# Patient Record
Sex: Male | Born: 1955 | Race: White | Hispanic: No | Marital: Married | Smoking: Former smoker
Health system: Southern US, Community
[De-identification: ages and names within clinical notes are randomized; demographics above are authoritative.]

## PROBLEM LIST (undated history)

## (undated) DIAGNOSIS — E039 Hypothyroidism, unspecified: Secondary | ICD-10-CM

## (undated) DIAGNOSIS — E7800 Pure hypercholesterolemia, unspecified: Secondary | ICD-10-CM

## (undated) DIAGNOSIS — IMO0001 Reserved for inherently not codable concepts without codable children: Secondary | ICD-10-CM

## (undated) DIAGNOSIS — J45909 Unspecified asthma, uncomplicated: Secondary | ICD-10-CM

## (undated) HISTORY — PX: NASAL SINUS SURGERY: SHX719

---

## 1994-12-28 HISTORY — PX: FLEXIBLE SIGMOIDOSCOPY: SHX1649

## 2014-10-24 ENCOUNTER — Encounter (INDEPENDENT_AMBULATORY_CARE_PROVIDER_SITE_OTHER): Payer: Self-pay | Admitting: *Deleted

## 2014-11-01 ENCOUNTER — Encounter (INDEPENDENT_AMBULATORY_CARE_PROVIDER_SITE_OTHER): Payer: Self-pay | Admitting: *Deleted

## 2014-11-01 ENCOUNTER — Telehealth (INDEPENDENT_AMBULATORY_CARE_PROVIDER_SITE_OTHER): Payer: Self-pay | Admitting: *Deleted

## 2014-11-01 ENCOUNTER — Other Ambulatory Visit (INDEPENDENT_AMBULATORY_CARE_PROVIDER_SITE_OTHER): Payer: Self-pay | Admitting: *Deleted

## 2014-11-01 DIAGNOSIS — Z1211 Encounter for screening for malignant neoplasm of colon: Secondary | ICD-10-CM

## 2014-11-01 NOTE — Telephone Encounter (Signed)
Patient needs movi prep 

## 2014-11-05 MED ORDER — PEG-KCL-NACL-NASULF-NA ASC-C 100 G PO SOLR: 1.0000 | Freq: Once | ORAL | Status: DC

## 2014-12-25 ENCOUNTER — Telehealth (INDEPENDENT_AMBULATORY_CARE_PROVIDER_SITE_OTHER): Payer: Self-pay | Admitting: *Deleted

## 2014-12-25 NOTE — Telephone Encounter (Signed)
Referring MD/PCP: howard   Procedure: tcs  Reason/Indication:  screening  Has patient had this procedure before?  no  If so, when, by whom and where?    Is there a family history of colon cancer?  no  Who?  What age when diagnosed?    Is patient diabetic?   no      Does patient have prosthetic heart valve?  no  Do you have a pacemaker?  no  Has patient ever had endocarditis? no  Has patient had joint replacement within last 12 months?  no  Does patient tend to be constipated or take laxatives? no  Is patient on Coumadin, Plavix and/or Aspirin? no  Medications: Advair inhaler 100/50 mg, simvastatin 20 mg daily, levothyroxine 150 mcg daily, astelin nasal spray prn, nasonex prn, pro-air prn  Allergies: nkda  Medication Adjustment:   Procedure date & time: 01/24/15 at 830

## 2014-12-25 NOTE — Telephone Encounter (Signed)
agree

## 2015-01-24 ENCOUNTER — Encounter (HOSPITAL_COMMUNITY): Payer: Self-pay | Admitting: *Deleted

## 2015-01-24 ENCOUNTER — Ambulatory Visit (HOSPITAL_COMMUNITY)
Admission: RE | Admit: 2015-01-24 | Discharge: 2015-01-24 | Disposition: A | Discharge status: Home / Self Care | Payer: PRIVATE HEALTH INSURANCE | Source: Ambulatory Visit | Attending: Internal Medicine | Admitting: Internal Medicine

## 2015-01-24 ENCOUNTER — Encounter (HOSPITAL_COMMUNITY)
Admission: RE | Disposition: A | Discharge status: Home / Self Care | Payer: Self-pay | Source: Ambulatory Visit | Attending: Internal Medicine

## 2015-01-24 DIAGNOSIS — E039 Hypothyroidism, unspecified: Secondary | ICD-10-CM | POA: Diagnosis not present

## 2015-01-24 DIAGNOSIS — K644 Residual hemorrhoidal skin tags: Secondary | ICD-10-CM | POA: Insufficient documentation

## 2015-01-24 DIAGNOSIS — Z1211 Encounter for screening for malignant neoplasm of colon: Secondary | ICD-10-CM

## 2015-01-24 DIAGNOSIS — D122 Benign neoplasm of ascending colon: Secondary | ICD-10-CM | POA: Insufficient documentation

## 2015-01-24 DIAGNOSIS — E780 Pure hypercholesterolemia: Secondary | ICD-10-CM | POA: Insufficient documentation

## 2015-01-24 DIAGNOSIS — K5730 Diverticulosis of large intestine without perforation or abscess without bleeding: Secondary | ICD-10-CM | POA: Insufficient documentation

## 2015-01-24 DIAGNOSIS — Z79899 Other long term (current) drug therapy: Secondary | ICD-10-CM | POA: Insufficient documentation

## 2015-01-24 DIAGNOSIS — J45909 Unspecified asthma, uncomplicated: Secondary | ICD-10-CM | POA: Diagnosis not present

## 2015-01-24 DIAGNOSIS — Z87891 Personal history of nicotine dependence: Secondary | ICD-10-CM | POA: Insufficient documentation

## 2015-01-24 DIAGNOSIS — K649 Unspecified hemorrhoids: Secondary | ICD-10-CM

## 2015-01-24 DIAGNOSIS — D124 Benign neoplasm of descending colon: Secondary | ICD-10-CM

## 2015-01-24 HISTORY — DX: Hypothyroidism, unspecified: E03.9

## 2015-01-24 HISTORY — DX: Unspecified asthma, uncomplicated: J45.909

## 2015-01-24 HISTORY — PX: COLONOSCOPY: SHX5424

## 2015-01-24 HISTORY — DX: Pure hypercholesterolemia, unspecified: E78.00

## 2015-01-24 HISTORY — DX: Reserved for inherently not codable concepts without codable children: IMO0001

## 2015-01-24 SURGERY — COLONOSCOPY
Anesthesia: Moderate Sedation

## 2015-01-24 MED ORDER — MEPERIDINE HCL 50 MG/ML IJ SOLN
INTRAMUSCULAR | Status: AC
  Filled 2015-01-24: qty 1

## 2015-01-24 MED ORDER — STERILE WATER FOR IRRIGATION IR SOLN
Status: DC | PRN
  Administered 2015-01-24: 09:00:00

## 2015-01-24 MED ORDER — SODIUM CHLORIDE 0.9 % IV SOLN
INTRAVENOUS | Status: DC
  Administered 2015-01-24: 08:00:00 via INTRAVENOUS

## 2015-01-24 MED ORDER — MEPERIDINE HCL 50 MG/ML IJ SOLN
INTRAMUSCULAR | Status: DC | PRN
  Administered 2015-01-24 (×2): 25 mg via INTRAVENOUS

## 2015-01-24 MED ORDER — MIDAZOLAM HCL 5 MG/5ML IJ SOLN
INTRAMUSCULAR | Status: DC | PRN
  Administered 2015-01-24 (×3): 2 mg via INTRAVENOUS

## 2015-01-24 MED ORDER — MIDAZOLAM HCL 5 MG/5ML IJ SOLN
INTRAMUSCULAR | Status: AC
  Filled 2015-01-24: qty 10

## 2015-01-24 NOTE — Discharge Instructions (Signed)
Resume usual medications and high fiber diet. No driving for 24 hours.      High-Fiber Diet Fiber is found in fruits, vegetables, and grains. A high-fiber diet encourages the addition of more whole grains, legumes, fruits, and vegetables in your diet. The recommended amount of fiber for adult males is 38 g per day. For adult females, it is 25 g per day. Pregnant and lactating women should get 28 g of fiber per day. If you have a digestive or bowel problem, ask your caregiver for advice before adding high-fiber foods to your diet. Eat a variety of high-fiber foods instead of only a select few type of foods.  PURPOSE  To increase stool bulk.  To make bowel movements more regular to prevent constipation.  To lower cholesterol.  To prevent overeating. WHEN IS THIS DIET USED?  It may be used if you have constipation and hemorrhoids.  It may be used if you have uncomplicated diverticulosis (intestine condition) and irritable bowel syndrome.  It may be used if you need help with weight management.  It may be used if you want to add it to your diet as a protective measure against atherosclerosis, diabetes, and cancer. SOURCES OF FIBER  Whole-grain breads and cereals.  Fruits, such as apples, oranges, bananas, berries, prunes, and pears.  Vegetables, such as green peas, carrots, sweet potatoes, beets, broccoli, cabbage, spinach, and artichokes.  Legumes, such split peas, soy, lentils.  Almonds. FIBER CONTENT IN FOODS Starches and Grains / Dietary Fiber (g)  Cheerios, 1 cup / 3 g  Corn Flakes cereal, 1 cup / 0.7 g  Rice crispy treat cereal, 1 cup / 0.3 g  Instant oatmeal (cooked),  cup / 2 g  Frosted wheat cereal, 1 cup / 5.1 g  Brown, long-grain rice (cooked), 1 cup / 3.5 g  White, long-grain rice (cooked), 1 cup / 0.6 g  Enriched macaroni (cooked), 1 cup / 2.5 g Legumes / Dietary Fiber (g)  Baked beans (canned, plain, or vegetarian),  cup / 5.2 g  Kidney beans  (canned),  cup / 6.8 g  Pinto beans (cooked),  cup / 5.5 g Breads and Crackers / Dietary Fiber (g)  Plain or honey graham crackers, 2 squares / 0.7 g  Saltine crackers, 3 squares / 0.3 g  Plain, salted pretzels, 10 pieces / 1.8 g  Whole-wheat bread, 1 slice / 1.9 g  White bread, 1 slice / 0.7 g  Raisin bread, 1 slice / 1.2 g  Plain bagel, 3 oz / 2 g  Flour tortilla, 1 oz / 0.9 g  Corn tortilla, 1 small / 1.5 g  Hamburger or hotdog bun, 1 small / 0.9 g Fruits / Dietary Fiber (g)  Apple with skin, 1 medium / 4.4 g  Sweetened applesauce,  cup / 1.5 g  Banana,  medium / 1.5 g  Grapes, 10 grapes / 0.4 g  Orange, 1 small / 2.3 g  Raisin, 1.5 oz / 1.6 g  Melon, 1 cup / 1.4 g Vegetables / Dietary Fiber (g)  Green beans (canned),  cup / 1.3 g  Carrots (cooked),  cup / 2.3 g  Broccoli (cooked),  cup / 2.8 g  Peas (cooked),  cup / 4.4 g  Mashed potatoes,  cup / 1.6 g  Lettuce, 1 cup / 0.5 g  Corn (canned),  cup / 1.6 g  Tomato,  cup / 1.1 g Document Released: 12/14/2005 Document Revised: 06/14/2012 Document Reviewed: 03/17/2012 [employer] Patient Information 2015 [employer]. This information is not intended to replace advice given to you by your health care provider. Make sure you discuss any questions you have with your health care provider. Physician will call with biopsy results.        Colonoscopy, Care After These instructions give you information on caring for yourself after your procedure. Your doctor may also give you more specific instructions. Call your doctor if you have any problems or questions after your procedure. HOME CARE  Do not drive for 24 hours.  Do not sign important papers or use machinery for 24 hours.  You may shower.  You may go back to your usual activities, but go slower for the first 24 hours.  Take rest breaks often during the first 24 hours.  Walk around or use warm packs on your belly (abdomen) if you  have belly cramping or gas.  Drink enough fluids to keep your pee (urine) clear or pale yellow.  Resume your normal diet. Avoid heavy or fried foods.  Avoid drinking alcohol for 24 hours or as told by your doctor.  Only take medicines as told by your doctor. If a tissue sample (biopsy) was taken during the procedure:   Do not take aspirin or blood thinners for 7 days, or as told by your doctor.  Do not drink alcohol for 7 days, or as told by your doctor.  Eat soft foods for the first 24 hours. GET HELP IF: You still have a small amount of blood in your poop (stool) 2-3 days after the procedure. GET HELP RIGHT AWAY IF:  You have more than a small amount of blood in your poop.  You see clumps of tissue (blood clots) in your poop.  Your belly is puffy (swollen).  You feel sick to your stomach (nauseous) or throw up (vomit).  You have a fever.  You have belly pain that gets worse and medicine does not help. MAKE SURE YOU:  Understand these instructions.  Will watch your condition.  Will get help right away if you are not doing well or get worse. Document Released: 01/16/2011 Document Revised: 12/19/2013 Document Reviewed: 08/21/2013 [facility] Patient Information 2015 [place], [place]. This information is not intended to replace advice given to you by your health care provider. Make sure you discuss any questions you have with your health care provider.

## 2015-01-24 NOTE — H&P (Signed)
Chad Curtis is an 59 y.o. male.   Chief Complaint: Patient is here for colonoscopy. HPI: Patient is 59-year-old Caucasian male who is here for screening colonoscopy. He denies abdominal pain change in bowel habits or rectal bleeding. This is patient's first colonoscopy. Family history is negative for CRC.  Past Medical History  Diagnosis Date  . Hypercholesteremia   . Asthma   . Shortness of breath dyspnea   . Hypothyroidism     Past Surgical History  Procedure Laterality Date  . Flexible sigmoidoscopy  1996  . Nasal sinus surgery      History reviewed. No pertinent family history. Social History:  reports that he has quit smoking. His smoking use included Cigarettes. He has a 2.5 pack-year smoking history. He does not have any smokeless tobacco history on file. He reports that he does not drink alcohol or use illicit drugs.  Allergies: No Known Allergies  Medications Prior to Admission  Medication Sig Dispense Refill  . Fluticasone-Salmeterol (ADVAIR) 100-50 MCG/DOSE AEPB Inhale 1 puff into the lungs 2 (two) times daily.    . levothyroxine (SYNTHROID, LEVOTHROID) 150 MCG tablet Take 150 mcg by mouth daily before breakfast.    . mometasone (NASONEX) 50 MCG/ACT nasal spray Place 2 sprays into the nose daily as needed (congestion).    . peg 3350 powder (MOVIPREP) 100 G SOLR Take 1 kit (200 g total) by mouth once. 1 kit 0  . simvastatin (ZOCOR) 20 MG tablet Take 20 mg by mouth 2 (two) times daily.      No results found for this or any previous visit (from the past 48 hour(s)). No results found.  ROS  Blood pressure 142/85, pulse 83, temperature 98.1 F (36.7 C), temperature source Oral, resp. rate 15, height 5' 11" (1.803 m), weight 230 lb (104.327 kg), SpO2 98 %. Physical Exam  Constitutional: He appears well-developed and well-nourished.  HENT:  Mouth/Throat: Oropharynx is clear and moist.  Eyes: Conjunctivae are normal. No scleral icterus.  Neck: No thyromegaly  present.  Cardiovascular: Normal rate, regular rhythm and normal heart sounds.   Respiratory: Effort normal and breath sounds normal.  GI: Soft. He exhibits no distension and no mass. There is no tenderness.  Musculoskeletal: He exhibits no edema.  Lymphadenopathy:    He has no cervical adenopathy.  Neurological: He is alert.  Skin: Skin is warm and dry.     Assessment/Plan Average risk screening colonoscopy.  REHMAN,NAJEEB U 01/24/2015, 8:32 AM    

## 2015-01-24 NOTE — Op Note (Signed)
COLONOSCOPY PROCEDURE REPORT  PATIENT:  Chad Curtis  MR#:  [id number] Birthdate:  09/29/1956, 59 y.o., male Endoscopist:  Dr. Rogene Houston, MD Referred By:  Dr. Rory Percy, MD  Procedure Date: 01/24/2015  Procedure:   Colonoscopy  Indications:  Patient is 59 year old Caucasian male was undergoing average risk screening colonoscopy.  Informed Consent:  The procedure and risks were reviewed with the patient and informed consent was obtained.  Medications:  Demerol 50 mg IV Versed 6 mg IV  Description of procedure:  After a digital rectal exam was performed, that colonoscope was advanced from the anus through the rectum and colon to the area of the cecum, ileocecal valve and appendiceal orifice. The cecum was deeply intubated. These structures were well-seen and photographed for the record. From the level of the cecum and ileocecal valve, the scope was slowly and cautiously withdrawn. The mucosal surfaces were carefully surveyed utilizing scope tip to flexion to facilitate fold flattening as needed. The scope was pulled down into the rectum where a thorough exam including retroflexion was performed.  Findings:   Prep excellent. Three small polyps cold snared from descending colon and submitted together. Few diverticula at descending and sigmoid colon. Normal rectal mucosa. Small hemorrhoids below the dentate line.    Therapeutic/Diagnostic Maneuvers Performed:  See above  Complications:  None  Cecal Withdrawal Time:  24 minutes  Impression:  Examination performed to cecum. Mild left-sided diverticulosis. Three small polyps cold snared from ascending colon and submitted together. External hemorrhoids.  Recommendations:  Standard instructions given. I will contact patient with biopsy results and further recommendations.  REHMAN,NAJEEB U  01/24/2015 9:16 AM  CC: Dr. Rory Percy, MD & Dr. Rayne Du ref. provider found

## 2015-01-25 ENCOUNTER — Encounter (HOSPITAL_COMMUNITY): Payer: Self-pay | Admitting: Internal Medicine

## 2015-02-13 ENCOUNTER — Encounter (INDEPENDENT_AMBULATORY_CARE_PROVIDER_SITE_OTHER): Payer: Self-pay | Admitting: *Deleted

## 2020-01-23 ENCOUNTER — Encounter (INDEPENDENT_AMBULATORY_CARE_PROVIDER_SITE_OTHER): Payer: Self-pay | Admitting: *Deleted

## 2020-03-05 ENCOUNTER — Other Ambulatory Visit (INDEPENDENT_AMBULATORY_CARE_PROVIDER_SITE_OTHER): Payer: Self-pay | Admitting: *Deleted

## 2020-03-05 DIAGNOSIS — Z86010 Personal history of colonic polyps: Secondary | ICD-10-CM

## 2020-04-01 ENCOUNTER — Encounter (INDEPENDENT_AMBULATORY_CARE_PROVIDER_SITE_OTHER): Payer: Self-pay | Admitting: *Deleted

## 2020-04-10 ENCOUNTER — Encounter (INDEPENDENT_AMBULATORY_CARE_PROVIDER_SITE_OTHER): Payer: Self-pay | Admitting: *Deleted

## 2020-04-18 ENCOUNTER — Ambulatory Visit (INDEPENDENT_AMBULATORY_CARE_PROVIDER_SITE_OTHER): Payer: Self-pay

## 2020-04-18 ENCOUNTER — Other Ambulatory Visit: Payer: Self-pay

## 2020-04-18 ENCOUNTER — Telehealth (INDEPENDENT_AMBULATORY_CARE_PROVIDER_SITE_OTHER): Payer: Self-pay | Admitting: *Deleted

## 2020-04-18 NOTE — Telephone Encounter (Signed)
Referring MD/PCP: howard   Procedure: tcs  Reason/Indication:  Hx polyps  Has patient had this procedure before?  Yes, 2016  If so, when, by whom and where?    Is there a family history of colon cancer?  no  Who?  What age when diagnosed?    Is patient diabetic?   no      Does patient have prosthetic heart valve or mechanical valve?  no  Do you have a pacemaker/defibrillator?  no  Has patient ever had endocarditis/atrial fibrillation? no  Does patient use oxygen? no  Has patient had joint replacement within last 12 months?  no  Is patient constipated or do they take laxatives? no  Does patient have a history of alcohol/drug use?  no  Is patient on blood thinner such as Coumadin, Plavix and/or Aspirin? no  Medications: levothyroxine 150 mcg daily, omeprazole 20 mg daily  Allergies: nkda  Medication Adjustment per Dr Laural Golden:   Procedure date & time: 05/16/20

## 2020-04-19 ENCOUNTER — Telehealth (INDEPENDENT_AMBULATORY_CARE_PROVIDER_SITE_OTHER): Payer: Self-pay | Admitting: *Deleted

## 2020-04-19 MED ORDER — SUTAB 1479-225-188 MG PO TABS: 1.0000 | ORAL_TABLET | Freq: Once | ORAL | 0 refills | Status: AC

## 2020-04-19 NOTE — Telephone Encounter (Signed)
Patient needs Sutab (copay card) ° °

## 2020-04-22 ENCOUNTER — Other Ambulatory Visit (INDEPENDENT_AMBULATORY_CARE_PROVIDER_SITE_OTHER): Payer: Self-pay | Admitting: *Deleted

## 2020-04-26 NOTE — Telephone Encounter (Signed)
Ok to schedule.

## 2020-05-14 ENCOUNTER — Other Ambulatory Visit: Payer: Self-pay

## 2020-05-14 ENCOUNTER — Other Ambulatory Visit (HOSPITAL_COMMUNITY)
Admission: RE | Admit: 2020-05-14 | Discharge: 2020-05-14 | Disposition: A | Discharge status: Home / Self Care | Payer: PRIVATE HEALTH INSURANCE | Source: Ambulatory Visit | Attending: Internal Medicine | Admitting: Internal Medicine

## 2020-05-14 DIAGNOSIS — Z20822 Contact with and (suspected) exposure to covid-19: Secondary | ICD-10-CM | POA: Insufficient documentation

## 2020-05-14 DIAGNOSIS — Z01812 Encounter for preprocedural laboratory examination: Secondary | ICD-10-CM | POA: Diagnosis not present

## 2020-05-15 LAB — SARS CORONAVIRUS 2 (TAT 6-24 HRS): SARS Coronavirus 2: NEGATIVE

## 2020-05-16 ENCOUNTER — Other Ambulatory Visit: Payer: Self-pay

## 2020-05-16 ENCOUNTER — Encounter (HOSPITAL_COMMUNITY): Payer: Self-pay | Admitting: Internal Medicine

## 2020-05-16 ENCOUNTER — Encounter (HOSPITAL_COMMUNITY)
Admission: RE | Disposition: A | Discharge status: Home / Self Care | Payer: Self-pay | Source: Home / Self Care | Attending: Internal Medicine

## 2020-05-16 ENCOUNTER — Ambulatory Visit (HOSPITAL_COMMUNITY)
Admission: RE | Admit: 2020-05-16 | Discharge: 2020-05-16 | Disposition: A | Discharge status: Home / Self Care | Payer: PRIVATE HEALTH INSURANCE | Attending: Internal Medicine | Admitting: Internal Medicine

## 2020-05-16 DIAGNOSIS — Z87891 Personal history of nicotine dependence: Secondary | ICD-10-CM | POA: Insufficient documentation

## 2020-05-16 DIAGNOSIS — Z86010 Personal history of colonic polyps: Secondary | ICD-10-CM | POA: Diagnosis not present

## 2020-05-16 DIAGNOSIS — K5730 Diverticulosis of large intestine without perforation or abscess without bleeding: Secondary | ICD-10-CM | POA: Insufficient documentation

## 2020-05-16 DIAGNOSIS — Z1211 Encounter for screening for malignant neoplasm of colon: Secondary | ICD-10-CM | POA: Insufficient documentation

## 2020-05-16 DIAGNOSIS — K644 Residual hemorrhoidal skin tags: Secondary | ICD-10-CM | POA: Diagnosis not present

## 2020-05-16 DIAGNOSIS — Z79899 Other long term (current) drug therapy: Secondary | ICD-10-CM | POA: Insufficient documentation

## 2020-05-16 DIAGNOSIS — Z7951 Long term (current) use of inhaled steroids: Secondary | ICD-10-CM | POA: Diagnosis not present

## 2020-05-16 DIAGNOSIS — Z79890 Hormone replacement therapy (postmenopausal): Secondary | ICD-10-CM | POA: Insufficient documentation

## 2020-05-16 DIAGNOSIS — E7800 Pure hypercholesterolemia, unspecified: Secondary | ICD-10-CM | POA: Insufficient documentation

## 2020-05-16 DIAGNOSIS — E039 Hypothyroidism, unspecified: Secondary | ICD-10-CM | POA: Diagnosis not present

## 2020-05-16 DIAGNOSIS — J45909 Unspecified asthma, uncomplicated: Secondary | ICD-10-CM | POA: Insufficient documentation

## 2020-05-16 HISTORY — PX: COLONOSCOPY: SHX5424

## 2020-05-16 SURGERY — COLONOSCOPY
Anesthesia: Moderate Sedation

## 2020-05-16 MED ORDER — MIDAZOLAM HCL 5 MG/5ML IJ SOLN
INTRAMUSCULAR | Status: DC | PRN
  Administered 2020-05-16 (×2): 1 mg via INTRAVENOUS
  Administered 2020-05-16 (×2): 2 mg via INTRAVENOUS

## 2020-05-16 MED ORDER — MIDAZOLAM HCL 5 MG/5ML IJ SOLN
INTRAMUSCULAR | Status: AC
  Filled 2020-05-16: qty 10

## 2020-05-16 MED ORDER — SODIUM CHLORIDE 0.9 % IV SOLN: INTRAVENOUS | Status: DC

## 2020-05-16 MED ORDER — MEPERIDINE HCL 50 MG/ML IJ SOLN
INTRAMUSCULAR | Status: DC | PRN
  Administered 2020-05-16 (×2): 25 mg via INTRAVENOUS

## 2020-05-16 MED ORDER — MEPERIDINE HCL 50 MG/ML IJ SOLN
INTRAMUSCULAR | Status: AC
  Filled 2020-05-16: qty 1

## 2020-05-16 MED ORDER — STERILE WATER FOR IRRIGATION IR SOLN
Status: DC | PRN
  Administered 2020-05-16: 1.5 mL

## 2020-05-16 NOTE — Discharge Instructions (Signed)
Resume usual medications as before. High-fiber diet. No driving for 24 hours. Next colonoscopy in 7 years.   Colonoscopy, Adult, Care After This sheet gives you information about how to care for yourself after your procedure. Your doctor may also give you more specific instructions. If you have problems or questions, call your doctor. What can I expect after the procedure? After the procedure, it is common to have:  A small amount of blood in your poop (stool) for 24 hours.  Some gas.  Mild cramping or bloating in your belly (abdomen). Follow these instructions at home: Eating and drinking   Drink enough fluid to keep your pee (urine) pale yellow.  Follow instructions from your doctor about what you cannot eat or drink.  Return to your normal diet as told by your doctor. Avoid heavy or fried foods that are hard to digest. Activity  Rest as told by your doctor.  Do not sit for a long time without moving. Get up to take short walks every 1-2 hours. This is important. Ask for help if you feel weak or unsteady.  Return to your normal activities as told by your doctor. Ask your doctor what activities are safe for you. To help cramping and bloating:   Try walking around.  Put heat on your belly as told by your doctor. Use the heat source that your doctor recommends, such as a moist heat pack or a heating pad. ? Put a towel between your skin and the heat source. ? Leave the heat on for 20-30 minutes. ? Remove the heat if your skin turns bright red. This is very important if you are unable to feel pain, heat, or cold. You may have a greater risk of getting burned. General instructions  For the first 24 hours after the procedure: ? Do not drive or use machinery. ? Do not sign important documents. ? Do not drink alcohol. ? Do your daily activities more slowly than normal. ? Eat foods that are soft and easy to digest.  Take over-the-counter or prescription medicines only as  told by your doctor.  Keep all follow-up visits as told by your doctor. This is important. Contact a doctor if:  You have blood in your poop 2-3 days after the procedure. Get help right away if:  You have more than a small amount of blood in your poop.  You see large clumps of tissue (blood clots) in your poop.  Your belly is swollen.  You feel like you may vomit (nauseous).  You vomit.  You have a fever.  You have belly pain that gets worse, and medicine does not help your pain. Summary  After the procedure, it is common to have a small amount of blood in your poop. You may also have mild cramping and bloating in your belly.  For the first 24 hours after the procedure, do not drive or use machinery, do not sign important documents, and do not drink alcohol.  Get help right away if you have a lot of blood in your poop, feel like you may vomit, have a fever, or have more belly pain. This information is not intended to replace advice given to you by your health care provider. Make sure you discuss any questions you have with your health care provider. Document Revised: 07/10/2019 Document Reviewed: 07/10/2019 Elsevier Patient Education  [place].  High-Fiber Diet Fiber, also called dietary fiber, is a type of carbohydrate that is found in fruits, vegetables, whole grains,  and beans. A high-fiber diet can have many health benefits. Your health care provider may recommend a high-fiber diet to help:  Prevent constipation. Fiber can make your bowel movements more regular.  Lower your cholesterol.  Relieve the following conditions: ? Swelling of veins in the anus (hemorrhoids). ? Swelling and irritation (inflammation) of specific areas of the digestive tract (uncomplicated diverticulosis). ? A problem of the large intestine (colon) that sometimes causes pain and diarrhea (irritable bowel syndrome, IBS).  Prevent overeating as part of a weight-loss plan.  Prevent heart  disease, type 2 diabetes, and certain cancers. What is my plan? The recommended daily fiber intake in grams (g) includes:  38 g for men age 5 or younger.  30 g for men over age 51.  41 g for women age 9 or younger.  21 g for women over age 33. You can get the recommended daily intake of dietary fiber by:  Eating a variety of fruits, vegetables, grains, and beans.  Taking a fiber supplement, if it is not possible to get enough fiber through your diet. What do I need to know about a high-fiber diet?  It is better to get fiber through food sources rather than from fiber supplements. There is not a lot of research about how effective supplements are.  Always check the fiber content on the nutrition facts label of any prepackaged food. Look for foods that contain 5 g of fiber or more per serving.  Talk with a diet and nutrition specialist (dietitian) if you have questions about specific foods that are recommended or not recommended for your medical condition, especially if those foods are not listed below.  Gradually increase how much fiber you consume. If you increase your intake of dietary fiber too quickly, you may have bloating, cramping, or gas.  Drink plenty of water. Water helps you to digest fiber. What are tips for following this plan?  Eat a wide variety of high-fiber foods.  Make sure that half of the grains that you eat each day are whole grains.  Eat breads and cereals that are made with whole-grain flour instead of refined flour or white flour.  Eat brown rice, bulgur wheat, or millet instead of white rice.  Start the day with a breakfast that is high in fiber, such as a cereal that contains 5 g of fiber or more per serving.  Use beans in place of meat in soups, salads, and pasta dishes.  Eat high-fiber snacks, such as berries, raw vegetables, nuts, and popcorn.  Choose whole fruits and vegetables instead of processed forms like juice or sauce. What foods can I  eat?  Fruits Berries. Pears. Apples. Oranges. Avocado. Prunes and raisins. Dried figs. Vegetables Sweet potatoes. Spinach. Kale. Artichokes. Cabbage. Broccoli. Cauliflower. Green peas. Carrots. Squash. Grains Whole-grain breads. Multigrain cereal. Oats and oatmeal. Brown rice. Barley. Bulgur wheat. [place]. Quinoa. Bran muffins. Popcorn. Rye wafer crackers. Meats and other proteins Navy, kidney, and pinto beans. Soybeans. Split peas. Lentils. Nuts and seeds. Dairy Fiber-fortified yogurt. Beverages Fiber-fortified soy milk. Fiber-fortified orange juice. Other foods Fiber bars. The items listed above may not be a complete list of recommended foods and beverages. Contact a dietitian for more options. What foods are not recommended? Fruits Fruit juice. Cooked, strained fruit. Vegetables Fried potatoes. Canned vegetables. Well-cooked vegetables. Grains White bread. Pasta made with refined flour. White rice. Meats and other proteins Fatty cuts of meat. Fried chicken or fried fish. Dairy Milk. Yogurt. Cream cheese. Sour cream. Fats and  oils But[place]. Beverages Soft drinks. Other foods Cakes and pastries. The items listed above may not be a complete list of foods and beverages to avoid. Contact a dietitian for more information. Summary  Fiber is a type of carbohydrate. It is found in fruits, vegetables, whole grains, and beans.  There are many health benefits of eating a high-fiber diet, such as preventing constipation, lowering blood cholesterol, helping with weight loss, and reducing your risk of heart disease, diabetes, and certain cancers.  Gradually increase your intake of fiber. Increasing too fast can result in cramping, bloating, and gas. Drink plenty of water while you increase your fiber.  The best sources of fiber include whole fruits and vegetables, whole grains, nuts, seeds, and beans. This information is not intended to replace advice given to you by your health care  provider. Make sure you discuss any questions you have with your health care provider. Document Revised: 10/18/2017 Document Reviewed: 10/18/2017 Elsevier Patient Education  2020 [employer].  Hemorrhoids Hemorrhoids are swollen veins in and around the rectum or anus. There are two types of hemorrhoids:  Internal hemorrhoids. These occur in the veins that are just inside the rectum. They may poke through to the outside and become irritated and painful.  External hemorrhoids. These occur in the veins that are outside the anus and can be felt as a painful swelling or hard lump near the anus. Most hemorrhoids do not cause serious problems, and they can be managed with home treatments such as diet and lifestyle changes. If home treatments do not help the symptoms, procedures can be done to shrink or remove the hemorrhoids. What are the causes? This condition is caused by increased pressure in the anal area. This pressure may result from various things, including:  Constipation.  Straining to have a bowel movement.  Diarrhea.  Pregnancy.  Obesity.  Sitting for long periods of time.  Heavy lifting or other activity that causes you to strain.  Anal sex.  Riding a bike for a long period of time. What are the signs or symptoms? Symptoms of this condition include:  Pain.  Anal itching or irritation.  Rectal bleeding.  Leakage of stool (feces).  Anal swelling.  One or more lumps around the anus. How is this diagnosed? This condition can often be diagnosed through a visual exam. Other exams or tests may also be done, such as:  An exam that involves feeling the rectal area with a gloved hand (digital rectal exam).  An exam of the anal canal that is done using a small tube (anoscope).  A blood test, if you have lost a significant amount of blood.  A test to look inside the colon using a flexible tube with a camera on the end (sigmoidoscopy or colonoscopy). How is this  treated? This condition can usually be treated at home. However, various procedures may be done if dietary changes, lifestyle changes, and other home treatments do not help your symptoms. These procedures can help make the hemorrhoids smaller or remove them completely. Some of these procedures involve surgery, and others do not. Common procedures include:  Rubber band ligation. Rubber bands are placed at the base of the hemorrhoids to cut off their blood supply.  Sclerotherapy. Medicine is injected into the hemorrhoids to shrink them.  Infrared coagulation. A type of light energy is used to get rid of the hemorrhoids.  Hemorrhoidectomy surgery. The hemorrhoids are surgically removed, and the veins that supply them are tied off.  Stapled hemorrhoidopexy  surgery. The surgeon staples the base of the hemorrhoid to the rectal wall. Follow these instructions at home: Eating and drinking   Eat foods that have a lot of fiber in them, such as whole grains, beans, nuts, fruits, and vegetables.  Ask your health care provider about taking products that have added fiber (fiber supplements).  Reduce the amount of fat in your diet. You can do this by eating low-fat dairy products, eating less red meat, and avoiding processed foods.  Drink enough fluid to keep your urine pale yellow. Managing pain and swelling   Take warm sitz baths for 20 minutes, 3-4 times a day to ease pain and discomfort. You may do this in a bathtub or using a portable sitz bath that fits over the toilet.  If directed, apply ice to the affected area. Using ice packs between sitz baths may be helpful. ? Put ice in a plastic bag. ? Place a towel between your skin and the bag. ? Leave the ice on for 20 minutes, 2-3 times a day. General instructions  Take over-the-counter and prescription medicines only as told by your health care provider.  Use medicated creams or suppositories as told.  Get regular exercise. Ask your health  care provider how much and what kind of exercise is best for you. In general, you should do moderate exercise for at least 30 minutes on most days of the week (150 minutes each week). This can include activities such as walking, biking, or yoga.  Go to the bathroom when you have the urge to have a bowel movement. Do not wait.  Avoid straining to have bowel movements.  Keep the anal area dry and clean. Use wet toilet paper or moist towelettes after a bowel movement.  Do not sit on the toilet for long periods of time. This increases blood pooling and pain.  Keep all follow-up visits as told by your health care provider. This is important. Contact a health care provider if you have:  Increasing pain and swelling that are not controlled by treatment or medicine.  Difficulty having a bowel movement, or you are unable to have a bowel movement.  Pain or inflammation outside the area of the hemorrhoids. Get help right away if you have:  Uncontrolled bleeding from your rectum. Summary  Hemorrhoids are swollen veins in and around the rectum or anus.  Most hemorrhoids can be managed with home treatments such as diet and lifestyle changes.  Taking warm sitz baths can help ease pain and discomfort.  In severe cases, procedures or surgery can be done to shrink or remove the hemorrhoids. This information is not intended to replace advice given to you by your health care provider. Make sure you discuss any questions you have with your health care provider. Document Revised: 05/12/2019 Document Reviewed: 05/05/2018 Elsevier Patient Education  [place].  Diverticulosis  Diverticulosis is a condition that develops when small pouches (diverticula) form in the wall of the large intestine (colon). The colon is where water is absorbed and stool (feces) is formed. The pouches form when the inside layer of the colon pushes through weak spots in the outer layers of the colon. You may have a few  pouches or many of them. The pouches usually do not cause problems unless they become inflamed or infected. When this happens, the condition is called diverticulitis. What are the causes? The cause of this condition is not known. What increases the risk? The following factors may make you more  likely to develop this condition:  Being older than age 54. Your risk for this condition increases with age. Diverticulosis is rare among people younger than age 60. By age 42, many people have it.  Eating a low-fiber diet.  Having frequent constipation.  Being overweight.  Not getting enough exercise.  Smoking.  Taking over-the-counter pain medicines, like aspirin and ibuprofen.  Having a family history of diverticulosis. What are the signs or symptoms? In most people, there are no symptoms of this condition. If you do have symptoms, they may include:  Bloating.  Cramps in the abdomen.  Constipation or diarrhea.  Pain in the lower left side of the abdomen. How is this diagnosed? Because diverticulosis usually has no symptoms, it is most often diagnosed during an exam for other colon problems. The condition may be diagnosed by:  Using a flexible scope to examine the colon (colonoscopy).  Taking an X-ray of the colon after dye has been put into the colon (barium enema).  Having a CT scan. How is this treated? You may not need treatment for this condition. Your health care provider may recommend treatment to prevent problems. You may need treatment if you have symptoms or if you previously had diverticulitis. Treatment may include:  Eating a high-fiber diet.  Taking a fiber supplement.  Taking a live bacteria supplement (probiotic).  Taking medicine to relax your colon. Follow these instructions at home: Medicines  Take over-the-counter and prescription medicines only as told by your health care provider.  If told by your health care provider, take a fiber supplement or  probiotic. Constipation prevention Your condition may cause constipation. To prevent or treat constipation, you may need to:  Drink enough fluid to keep your urine pale yellow.  Take over-the-counter or prescription medicines.  Eat foods that are high in fiber, such as beans, whole grains, and fresh fruits and vegetables.  Limit foods that are high in fat and processed sugars, such as fried or sweet foods.  General instructions  Try not to strain when you have a bowel movement.  Keep all follow-up visits as told by your health care provider. This is important. Contact a health care provider if you:  Have pain in your abdomen.  Have bloating.  Have cramps.  Have not had a bowel movement in 3 days. Get help right away if:  Your pain gets worse.  Your bloating becomes very bad.  You have a fever or chills, and your symptoms suddenly get worse.  You vomit.  You have bowel movements that are bloody or black.  You have bleeding from your rectum. Summary  Diverticulosis is a condition that develops when small pouches (diverticula) form in the wall of the large intestine (colon).  You may have a few pouches or many of them.  This condition is most often diagnosed during an exam for other colon problems.  Treatment may include increasing the fiber in your diet, taking supplements, or taking medicines. This information is not intended to replace advice given to you by your health care provider. Make sure you discuss any questions you have with your health care provider. Document Revised: 07/13/2019 Document Reviewed: 07/13/2019 Elsevier Patient Education  [place].

## 2020-05-16 NOTE — H&P (Signed)
Chad Curtis is an 64 y.o. male.   Chief Complaint: Patient is here for colonoscopy. HPI: Patient is 64 year old Caucasian male who has history of colonic adenomas and is here for surveillance colonoscopy.  His last exam was in February 2016 with removal of 3 small tubular adenomas.  He denies abdominal pain change in bowel habits or rectal bleeding. Family history is negative for CRC. Patient does not take antiplatelet agents or anticoagulants.  Past Medical History:  Diagnosis Date  . Asthma   . Hypercholesteremia   . Hypothyroidism   . Shortness of breath dyspnea     Past Surgical History:  Procedure Laterality Date  . COLONOSCOPY N/A 01/24/2015   Procedure: COLONOSCOPY;  Surgeon: Rogene Houston, MD;  Location: [facility];  Service: Endoscopy;  Laterality: N/A;  [address]  . [place]  . NASAL SINUS SURGERY      History reviewed. No pertinent family history. Social History:  reports that he has quit smoking. His smoking use included cigarettes. He has a 2.50 pack-year smoking history. He has never used smokeless tobacco. He reports that he does not drink alcohol or use drugs.  Allergies: No Known Allergies  Medications Prior to Admission  Medication Sig Dispense Refill  . azelastine (ASTELIN) 0.1 % nasal spray Place 1 spray into both nostrils at bedtime as needed for rhinitis or allergies. Use in each nostril as directed    . Fluticasone-Salmeterol (ADVAIR) 100-50 MCG/DOSE AEPB Inhale 1 puff into the lungs 2 (two) times daily.    Marland Kitchen levothyroxine (SYNTHROID, LEVOTHROID) 150 MCG tablet Take 150 mcg by mouth daily before breakfast.    . loratadine (CLARITIN) 10 MG tablet Take 10 mg by mouth daily as needed for allergies.    . mometasone (NASONEX) 50 MCG/ACT nasal spray Place 2 sprays into the nose daily as needed (congestion).    . simvastatin (ZOCOR) 20 MG tablet Take 20 mg by mouth at bedtime.       Results for orders placed or performed during the hospital  encounter of 05/14/20 (from the past 48 hour(s))  SARS CORONAVIRUS 2 (TAT 6-24 HRS) Nasopharyngeal Nasopharyngeal Swab     Status: None   Collection Time: 05/14/20  3:45 PM   Specimen: Nasopharyngeal Swab  Result Value Ref Range   SARS Coronavirus 2 NEGATIVE NEGATIVE    Comment: (NOTE) SARS-CoV-2 target nucleic acids are NOT DETECTED. The SARS-CoV-2 RNA is generally detectable in upper and lower respiratory specimens during the acute phase of infection. Negative results do not preclude SARS-CoV-2 infection, do not rule out co-infections with other pathogens, and should not be used as the sole basis for treatment or other patient management decisions. Negative results must be combined with clinical observations, patient history, and epidemiological information. The expected result is Negative. Fact Sheet for Patients: SugarRoll.be Fact Sheet for Healthcare Providers: https://www.woods-mathews.com/ This test is not yet approved or cleared by the [place] FDA and  has been authorized for detection and/or diagnosis of SARS-CoV-2 by FDA under an Emergency Use Authorization (EUA). This EUA will remain  in effect (meaning this test can be used) for the duration of the COVID-19 declaration under Section 56 4(b)(1) of the Act, 21 U.S.C. section 360bbb-3(b)(1), unless the authorization is terminated or revoked sooner. Performed at [facility], [place] [address] [place], [place] [address]    No results found.  Review of Systems  Blood pressure (!) 143/76, pulse 82, temperature 97.8 F (36.6 C), temperature source Oral, resp.  rate 15, height 6' (1.829 m), weight 102.1 kg, SpO2 98 %. Physical Exam  Constitutional: He appears well-developed and well-nourished.  HENT:  Mouth/Throat: Oropharynx is clear and moist.  Eyes: Conjunctivae are normal. No scleral icterus.  Neck: No thyromegaly present.  Cardiovascular: Normal rate, regular rhythm  and normal heart sounds.  No murmur heard. Respiratory: Effort normal and breath sounds normal.  GI: Soft. He exhibits no distension and no mass. There is no abdominal tenderness.  Musculoskeletal:        General: No edema.  Lymphadenopathy:    He has no cervical adenopathy.  Neurological: He is alert.  Skin: Skin is warm and dry.     Assessment/Plan History of colonic adenomas. Surveillance colonoscopy.  Hildred Laser, MD 05/16/2020, 8:13 AM

## 2020-05-16 NOTE — Op Note (Signed)
[facility] Patient Name: Chad Curtis Procedure Date: 05/16/2020 7:58 AM MRN: [id number] Date of Birth: 12/25/1956 Attending MD: Hildred Laser , MD CSN: [id number] Age: 64 Admit Type: Outpatient Procedure:                Colonoscopy Indications:              High risk colon cancer surveillance: Personal                            history of colonic polyps Providers:                Hildred Laser, MD, Otis Peak B. Sharon Seller, RN, Randa Spike, Technician Referring MD:             Rory Percy, MD Medicines:                Meperidine 50 mg IV, Midazolam 6 mg IV Complications:            No immediate complications. Estimated Blood Loss:     Estimated blood loss: none. Procedure:                Pre-Anesthesia Assessment:                           - Prior to the procedure, a History and Physical                            was performed, and patient medications and                            allergies were reviewed. The patient's tolerance of                            previous anesthesia was also reviewed. The risks                            and benefits of the procedure and the sedation                            options and risks were discussed with the patient.                            All questions were answered, and informed consent                            was obtained. Prior Anticoagulants: The patient has                            taken no previous anticoagulant or antiplatelet                            agents. ASA Grade Assessment: II - A patient with  mild systemic disease. After reviewing the risks                            and benefits, the patient was deemed in                            satisfactory condition to undergo the procedure.                           After obtaining informed consent, the colonoscope                            was passed under direct vision. Throughout the   procedure, the patient's blood pressure, pulse, and                            oxygen saturations were monitored continuously. The                            [id number] scope was introduced through                            the anus and advanced to the the terminal ileum,                            with identification of the appendiceal orifice and                            IC valve. The colonoscopy was performed without                            difficulty. The patient tolerated the procedure                            well. The quality of the bowel preparation was                            excellent. The terminal ileum, ileocecal valve,                            appendiceal orifice, and rectum were photographed. Scope In: 8:24:38 AM Scope Out: 8:38:43 AM Scope Withdrawal Time: 0 hours 12 minutes 0 seconds  Total Procedure Duration: 0 hours 14 minutes 5 seconds  Findings:      Skin tags were found on perianal exam.      The terminal ileum appeared normal.      A few diverticula were found in the sigmoid colon.      External hemorrhoids were found during retroflexion. The hemorrhoids       were small. Impression:               - Perianal skin tags found on perianal exam.                           - The examined portion of the ileum was normal.                           -  Diverticulosis in the sigmoid colon.                           - External hemorrhoids.                           - No specimens collected. Moderate Sedation:      Moderate (conscious) sedation was administered by the endoscopy nurse       and supervised by the endoscopist. The following parameters were       monitored: oxygen saturation, heart rate, blood pressure, CO2       capnography and response to care. Total physician intraservice time was       19 minutes. Recommendation:           - Patient has a contact number available for                            emergencies. The signs and symptoms of  potential                            delayed complications were discussed with the                            patient. Return to normal activities tomorrow.                            Written discharge instructions were provided to the                            patient.                           - High fiber diet today.                           - Continue present medications.                           - Repeat colonoscopy in 7 years for surveillance. Procedure Code(s):        --- Professional ---                           [phone], Colonoscopy, flexible; diagnostic, including                            collection of specimen(s) by brushing or washing,                            when performed (separate procedure)                           G0500, Moderate sedation services provided by the                            same physician or other qualified health care  professional performing a gastrointestinal                            endoscopic service that sedation supports,                            requiring the presence of an independent trained                            observer to assist in the monitoring of the                            patient's level of consciousness and physiological                            status; initial 15 minutes of intra-service time;                            patient age 69 years or older (additional time may                            be reported with [id number], as appropriate) Diagnosis Code(s):        --- Professional ---                           K64.4, Residual hemorrhoidal skin tags                           Z86.010, Personal history of colonic polyps                           K57.30, Diverticulosis of large intestine without                            perforation or abscess without bleeding CPT copyright 2019 [employer]. All rights reserved. The codes documented in this report are preliminary and upon coder  review may  be revised to meet current compliance requirements. Hildred Laser, MD Hildred Laser, MD 05/16/2020 8:46:25 AM This report has been signed electronically. Number of Addenda: 0

## 2021-02-04 DIAGNOSIS — E7800 Pure hypercholesterolemia, unspecified: Secondary | ICD-10-CM | POA: Diagnosis not present

## 2021-02-04 DIAGNOSIS — N400 Enlarged prostate without lower urinary tract symptoms: Secondary | ICD-10-CM | POA: Diagnosis not present

## 2021-02-04 DIAGNOSIS — K219 Gastro-esophageal reflux disease without esophagitis: Secondary | ICD-10-CM | POA: Diagnosis not present

## 2021-02-04 DIAGNOSIS — E7801 Familial hypercholesterolemia: Secondary | ICD-10-CM | POA: Diagnosis not present

## 2021-02-04 DIAGNOSIS — R739 Hyperglycemia, unspecified: Secondary | ICD-10-CM | POA: Diagnosis not present

## 2021-02-04 DIAGNOSIS — E039 Hypothyroidism, unspecified: Secondary | ICD-10-CM | POA: Diagnosis not present

## 2021-02-06 DIAGNOSIS — R030 Elevated blood-pressure reading, without diagnosis of hypertension: Secondary | ICD-10-CM | POA: Diagnosis not present

## 2021-02-06 DIAGNOSIS — J45909 Unspecified asthma, uncomplicated: Secondary | ICD-10-CM | POA: Diagnosis not present

## 2021-02-06 DIAGNOSIS — E039 Hypothyroidism, unspecified: Secondary | ICD-10-CM | POA: Diagnosis not present

## 2021-02-06 DIAGNOSIS — E7800 Pure hypercholesterolemia, unspecified: Secondary | ICD-10-CM | POA: Diagnosis not present

## 2021-02-06 DIAGNOSIS — Z6833 Body mass index (BMI) 33.0-33.9, adult: Secondary | ICD-10-CM | POA: Diagnosis not present

## 2021-02-06 DIAGNOSIS — Z0000 Encounter for general adult medical examination without abnormal findings: Secondary | ICD-10-CM | POA: Diagnosis not present

## 2021-02-20 DIAGNOSIS — Z23 Encounter for immunization: Secondary | ICD-10-CM | POA: Diagnosis not present

## 2021-03-27 DIAGNOSIS — L309 Dermatitis, unspecified: Secondary | ICD-10-CM | POA: Diagnosis not present

## 2021-03-27 DIAGNOSIS — L299 Pruritus, unspecified: Secondary | ICD-10-CM | POA: Diagnosis not present

## 2021-04-07 DIAGNOSIS — Z23 Encounter for immunization: Secondary | ICD-10-CM | POA: Diagnosis not present

## 2021-05-19 ENCOUNTER — Emergency Department (HOSPITAL_COMMUNITY): Payer: Medicare HMO

## 2021-05-19 ENCOUNTER — Emergency Department (HOSPITAL_COMMUNITY)
Admission: EM | Admit: 2021-05-19 | Discharge: 2021-05-19 | Disposition: A | Discharge status: Home / Self Care | Payer: Medicare HMO | Attending: Emergency Medicine | Admitting: Emergency Medicine

## 2021-05-19 ENCOUNTER — Encounter (HOSPITAL_COMMUNITY): Payer: Self-pay

## 2021-05-19 ENCOUNTER — Other Ambulatory Visit: Payer: Self-pay

## 2021-05-19 DIAGNOSIS — Z79899 Other long term (current) drug therapy: Secondary | ICD-10-CM | POA: Diagnosis not present

## 2021-05-19 DIAGNOSIS — N281 Cyst of kidney, acquired: Secondary | ICD-10-CM | POA: Diagnosis not present

## 2021-05-19 DIAGNOSIS — K5790 Diverticulosis of intestine, part unspecified, without perforation or abscess without bleeding: Secondary | ICD-10-CM | POA: Diagnosis not present

## 2021-05-19 DIAGNOSIS — E039 Hypothyroidism, unspecified: Secondary | ICD-10-CM | POA: Diagnosis not present

## 2021-05-19 DIAGNOSIS — J45909 Unspecified asthma, uncomplicated: Secondary | ICD-10-CM | POA: Insufficient documentation

## 2021-05-19 DIAGNOSIS — M47814 Spondylosis without myelopathy or radiculopathy, thoracic region: Secondary | ICD-10-CM | POA: Diagnosis not present

## 2021-05-19 DIAGNOSIS — Z7951 Long term (current) use of inhaled steroids: Secondary | ICD-10-CM | POA: Diagnosis not present

## 2021-05-19 DIAGNOSIS — Z87891 Personal history of nicotine dependence: Secondary | ICD-10-CM | POA: Diagnosis not present

## 2021-05-19 DIAGNOSIS — I700 Atherosclerosis of aorta: Secondary | ICD-10-CM | POA: Diagnosis not present

## 2021-05-19 DIAGNOSIS — R079 Chest pain, unspecified: Secondary | ICD-10-CM | POA: Diagnosis not present

## 2021-05-19 DIAGNOSIS — R1013 Epigastric pain: Secondary | ICD-10-CM | POA: Insufficient documentation

## 2021-05-19 DIAGNOSIS — J479 Bronchiectasis, uncomplicated: Secondary | ICD-10-CM | POA: Diagnosis not present

## 2021-05-19 DIAGNOSIS — R7989 Other specified abnormal findings of blood chemistry: Secondary | ICD-10-CM | POA: Diagnosis not present

## 2021-05-19 DIAGNOSIS — R0789 Other chest pain: Secondary | ICD-10-CM | POA: Insufficient documentation

## 2021-05-19 DIAGNOSIS — J8410 Pulmonary fibrosis, unspecified: Secondary | ICD-10-CM | POA: Diagnosis not present

## 2021-05-19 DIAGNOSIS — K429 Umbilical hernia without obstruction or gangrene: Secondary | ICD-10-CM | POA: Diagnosis not present

## 2021-05-19 LAB — HEPATIC FUNCTION PANEL
ALT: 32 U/L (ref 0–44)
AST: 30 U/L (ref 15–41)
Albumin: 4.2 g/dL (ref 3.5–5.0)
Alkaline Phosphatase: 62 U/L (ref 38–126)
Bilirubin, Direct: 0.1 mg/dL (ref 0.0–0.2)
Indirect Bilirubin: 0.5 mg/dL (ref 0.3–0.9)
Total Bilirubin: 0.6 mg/dL (ref 0.3–1.2)
Total Protein: 7.4 g/dL (ref 6.5–8.1)

## 2021-05-19 LAB — CBC
HCT: 45.6 % (ref 39.0–52.0)
Hemoglobin: 15.7 g/dL (ref 13.0–17.0)
MCH: 31.8 pg (ref 26.0–34.0)
MCHC: 34.4 g/dL (ref 30.0–36.0)
MCV: 92.5 fL (ref 80.0–100.0)
Platelets: 291 10*3/uL (ref 150–400)
RBC: 4.93 MIL/uL (ref 4.22–5.81)
RDW: 12.1 % (ref 11.5–15.5)
WBC: 10 10*3/uL (ref 4.0–10.5)
nRBC: 0 % (ref 0.0–0.2)

## 2021-05-19 LAB — TROPONIN I (HIGH SENSITIVITY)
Troponin I (High Sensitivity): 4 ng/L (ref <18)
Troponin I (High Sensitivity): 4 ng/L (ref <18)

## 2021-05-19 LAB — D-DIMER, QUANTITATIVE: D-Dimer, Quant: 1.78 ug/mL-FEU — ABNORMAL HIGH (ref 0.00–0.50)

## 2021-05-19 LAB — BASIC METABOLIC PANEL
Anion gap: 8 (ref 5–15)
BUN: 13 mg/dL (ref 8–23)
CO2: 26 mmol/L (ref 22–32)
Calcium: 9.8 mg/dL (ref 8.9–10.3)
Chloride: 102 mmol/L (ref 98–111)
Creatinine, Ser: 0.89 mg/dL (ref 0.61–1.24)
GFR, Estimated: >60 mL/min (ref >60)
Glucose, Bld: 111 mg/dL — ABNORMAL HIGH (ref 70–99)
Potassium: 4.4 mmol/L (ref 3.5–5.1)
Sodium: 136 mmol/L (ref 135–145)

## 2021-05-19 LAB — LIPASE, BLOOD: Lipase: 48 U/L (ref 11–51)

## 2021-05-19 MED ORDER — PANTOPRAZOLE SODIUM 40 MG IV SOLR
40.0000 mg | Freq: Once | INTRAVENOUS | Status: AC
  Administered 2021-05-19: 40 mg via INTRAVENOUS
  Filled 2021-05-19: qty 40

## 2021-05-19 MED ORDER — IOHEXOL 350 MG/ML SOLN
100.0000 mL | Freq: Once | INTRAVENOUS | Status: AC | PRN
  Administered 2021-05-19: 100 mL via INTRAVENOUS

## 2021-05-19 NOTE — Discharge Instructions (Addendum)
Continue the Prilosec 20 mg twice a day.  Make an appointment to follow-up with Dr. Laural Golden.  Rest of work-up today without evidence of any acute cardiac event or any acute process in the lungs or abdomen.

## 2021-05-19 NOTE — ED Provider Notes (Signed)
[place] Provider Note   CSN: [id number] Arrival date & time: 05/19/21  [id number]     History Chief Complaint  Patient presents with  . Chest Pain    Chad Curtis is a 65 y.o. male.  Patient with some voice hoarseness for about 2 weeks.  Saw ear nose and throat.  And he felt it was due to reflux.  Did review vocal cords.  Increased him on his Prilosec from 20 a day to 40 a day.  Patient comes in today with complaints of chest pain more as a tightness.  Nurses wrote that he states he goes to the back I did not really get that from him.  Also said associated with epigastric pain as well.  He has had this ongoing at times.  But has been worse since about 2 in the morning.  Improving some here today.  Patient had been followed by Dr. Laural Golden in the past.  Gastroenterology.  Patient states there has been some shortness of breath with this.   Patient's past medical history significant for the gastroesophageal reflux asthma hypercholesterol hypothyroidism and shortness of breath.        Past Medical History:  Diagnosis Date  . Asthma   . Hypercholesteremia   . Hypothyroidism   . Shortness of breath dyspnea     There are no problems to display for this patient.   Past Surgical History:  Procedure Laterality Date  . COLONOSCOPY N/A 01/24/2015   Procedure: COLONOSCOPY;  Surgeon: Rogene Houston, MD;  Location: [facility];  Service: Endoscopy;  Laterality: N/A;  830  . COLONOSCOPY N/A 05/16/2020   Procedure: COLONOSCOPY;  Surgeon: Rogene Houston, MD;  Location: [facility];  Service: Endoscopy;  Laterality: N/A;  830  . [place]  . NASAL SINUS SURGERY         No family history on file.  Social History   Tobacco Use  . Smoking status: Former Smoker    Packs/day: 0.50    Years: 5.00    Pack years: 2.50    Types: Cigarettes  . Smokeless tobacco: Never Used  Substance Use Topics  . Alcohol use: No  . Drug use: No    Home  Medications Prior to Admission medications   Medication Sig Start Date End Date Taking? Authorizing Provider  albuterol (VENTOLIN HFA) 108 (90 Base) MCG/ACT inhaler Inhale 2 puffs into the lungs 4 (four) times daily. 12/15/20  Yes [provider]  azelastine (ASTELIN) 0.1 % nasal spray Place 1 spray into both nostrils at bedtime as needed for rhinitis or allergies. Use in each nostril as directed   Yes [provider]  Fluticasone-Salmeterol (ADVAIR) 100-50 MCG/DOSE AEPB Inhale 1 puff into the lungs 2 (two) times daily.   Yes [provider]  levothyroxine (SYNTHROID, LEVOTHROID) 150 MCG tablet Take 150 mcg by mouth daily before breakfast.   Yes [provider]  loratadine (CLARITIN) 10 MG tablet Take 10 mg by mouth daily as needed for allergies.   Yes [provider]  mometasone (NASONEX) 50 MCG/ACT nasal spray Place 2 sprays into the nose daily as needed (congestion).   Yes [provider]  omeprazole (PRILOSEC) 20 MG capsule Take by mouth 2 (two) times daily. 11/25/20  Yes [provider]  simvastatin (ZOCOR) 20 MG tablet Take 20 mg by mouth at bedtime.    Yes [provider]    Allergies    Patient has no known  allergies.  Review of Systems   Review of Systems  Constitutional: Negative for chills and fever.  HENT: Positive for voice change. Negative for rhinorrhea and sore throat.   Eyes: Negative for visual disturbance.  Respiratory: Positive for shortness of breath. Negative for cough.   Cardiovascular: Positive for chest pain. Negative for leg swelling.  Gastrointestinal: Positive for abdominal pain. Negative for diarrhea, nausea and vomiting.  Genitourinary: Negative for dysuria.  Musculoskeletal: Negative for back pain and neck pain.  Skin: Negative for rash.  Neurological: Negative for dizziness, light-headedness and headaches.  Hematological: Does not bruise/bleed easily.  Psychiatric/Behavioral: Negative  for confusion.    Physical Exam Updated Vital Signs BP 116/76   Pulse 72   Temp 98.2 F (36.8 C) (Oral)   Resp 16   Ht 1.829 m (6')   Wt 96.2 kg   SpO2 96%   BMI 28.75 kg/m   Physical Exam Vitals and nursing note reviewed.  Constitutional:      General: He is not in acute distress.    Appearance: Normal appearance. He is well-developed.  HENT:     Head: Normocephalic and atraumatic.     Mouth/Throat:     Mouth: Mucous membranes are moist.  Eyes:     Extraocular Movements: Extraocular movements intact.     Conjunctiva/sclera: Conjunctivae normal.     Pupils: Pupils are equal, round, and reactive to light.  Cardiovascular:     Rate and Rhythm: Normal rate and regular rhythm.     Heart sounds: No murmur heard.   Pulmonary:     Effort: Pulmonary effort is normal. No respiratory distress.     Breath sounds: Normal breath sounds. No wheezing.  Chest:     Chest wall: No tenderness.  Abdominal:     Palpations: Abdomen is soft.     Tenderness: There is no abdominal tenderness.  Musculoskeletal:        General: No swelling. Normal range of motion.     Cervical back: Normal range of motion and neck supple. No rigidity.  Skin:    General: Skin is warm and dry.     Capillary Refill: Capillary refill takes 2 to 3 seconds.  Neurological:     General: No focal deficit present.     Mental Status: He is alert and oriented to person, place, and time.     Cranial Nerves: No cranial nerve deficit.     Sensory: No sensory deficit.     Motor: No weakness.     ED Results / Procedures / Treatments   Labs (all labs ordered are listed, but only abnormal results are displayed) Labs Reviewed  BASIC METABOLIC PANEL - Abnormal; Notable for the following components:      Result Value   Glucose, Bld 111 (*)    All other components within normal limits  D-DIMER, QUANTITATIVE - Abnormal; Notable for the following components:   D-Dimer, Quant 1.78 (*)    All other components within  normal limits  CBC  LIPASE, BLOOD  HEPATIC FUNCTION PANEL  TROPONIN I (HIGH SENSITIVITY)  TROPONIN I (HIGH SENSITIVITY)    EKG EKG Interpretation  Date/Time:  Monday May 19 2021 07:19:59 EDT Ventricular Rate:  74 PR Interval:  177 QRS Duration: 84 QT Interval:  363 QTC Calculation: 403 R Axis:   -25 Text Interpretation: Sinus rhythm Borderline left axis deviation Anteroseptal infarct, age indeterminate No previous ECGs available Confirmed by Fredia Sorrow [phone]) on 05/19/2021 8:10:05 AM   Radiology CT Angio Chest  PE W/Cm &/Or Wo Cm  Result Date: 05/19/2021 CLINICAL DATA:  PE suspected, low/intermediate prob, positive D-dimer EXAM: CT ANGIOGRAPHY CHEST WITH CONTRAST TECHNIQUE: Multidetector CT imaging of the chest was performed using the standard protocol during bolus administration of intravenous contrast. Multiplanar CT image reconstructions and MIPs were obtained to evaluate the vascular anatomy. CONTRAST:  155mL OMNIPAQUE IOHEXOL 350 MG/ML SOLN COMPARISON:  None. FINDINGS: Cardiovascular: Satisfactory opacification of the pulmonary arteries to the segmental level. No evidence of pulmonary embolism. Normal heart size. No pericardial effusion. Mediastinum/Nodes: No enlarged mediastinal, hilar, or axillary lymph nodes. Atrophic thyroid. Unremarkable trachea and esophagus demonstrate no significant findings. Lungs/Pleura: There is bilateral, subpleural reticulation with mild bronchiolectasis in the costophrenic sulci. There are no suspicious pulmonary nodules or masses. There is no focal airspace consolidation. Upper Abdomen: Please see separately dictated CT of the abdomen pelvis for findings below the diaphragm. Musculoskeletal: No suspicious lytic or blastic lesions. No acute osseous abnormality. Mild thoracic spondylosis. Review of the MIP images confirms the above findings. IMPRESSION: No evidence of pulmonary embolism or other acute findings in the chest. 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 Recommend pulmonology referral. See separately dictated CT of the abdomen and pelvis for findings below the diaphragm. Electronically Signed   By: Maurine Simmering   On: 05/19/2021 13:39   CT ABDOMEN PELVIS W CONTRAST  Result Date: 05/19/2021 CLINICAL DATA:  Epigastric pain radiating into the back and difficulty breathing for 1 week. EXAM: CT ABDOMEN AND PELVIS WITH CONTRAST TECHNIQUE: Multidetector CT imaging of the abdomen and pelvis was performed using the standard protocol following bolus administration of intravenous contrast. CONTRAST:  100 mL OMNIPAQUE IOHEXOL 350 MG/ML SOLN COMPARISON:  None. FINDINGS: Lower chest: See report of chest CT today. Hepatobiliary: No focal liver abnormality is seen. No gallstones, gallbladder wall thickening, or biliary dilatation. Pancreas: Unremarkable. No pancreatic ductal dilatation or surrounding inflammatory changes. Spleen: Normal in size without focal abnormality. Adrenals/Urinary Tract: Adrenal glands are unremarkable. No renal calculi or hydronephrosis. Tiny left renal cyst noted. Bladder is unremarkable. Stomach/Bowel: Stomach is within normal limits. Appendix appears normal. No evidence of bowel wall thickening, distention, or inflammatory changes. Scattered descending and sigmoid colon diverticula are seen. Vascular/Lymphatic: Aortic atherosclerosis. No enlarged abdominal or pelvic lymph nodes. Reproductive: Prostate is unremarkable. Other: Very small fat containing umbilical hernia. Musculoskeletal: Negative. IMPRESSION: No acute abnormality or finding to explain the patient's symptoms. Diverticulosis without diverticulitis. Aortic Atherosclerosis (ICD10-I70.0). Electronically Signed   By: Inge Rise M.D.   On: 05/19/2021 13:35   DG Chest Port 1 View  Result Date: 05/19/2021 CLINICAL DATA:  65 year old male with chest pain.  Former smoker. EXAM: PORTABLE CHEST 1 VIEW  COMPARISON:  None. FINDINGS: Portable AP upright view at 0754 hours. Lung volumes and mediastinal contours are within normal limits. Coarse bilateral pulmonary interstitial opacity, although with a somewhat chronic appearance. Otherwise Allowing for portable technique the lungs are clear. Visualized tracheal air column is within normal limits. No pneumothorax or pleural effusion. Paucity of bowel gas in the upper abdomen. No acute osseous abnormality identified. IMPRESSION: Probably chronic and smoking related bilateral pulmonary interstitial opacity. No convincing acute cardiopulmonary abnormality. Electronically Signed   By: Genevie Ann M.D.   On: 05/19/2021 08:16    Procedures Procedures   Medications Ordered in ED Medications  pantoprazole (PROTONIX) injection 40 mg (40 mg Intravenous Given 05/19/21 0817)  iohexol (OMNIPAQUE) 350 MG/ML injection 100 mL (100 mLs Intravenous Contrast Given  05/19/21 1248)    ED Course  I have reviewed the triage vital signs and the nursing notes.  Pertinent labs & imaging results that were available during my care of the patient were reviewed by me and considered in my medical decision making (see chart for details).    MDM Rules/Calculators/A&P                           Extensive work-up without any acute findings.  Feel this probably is secondary to his gastroesophageal reflux.  Troponins x2 without any acute findings.  D-dimer was elevated at 1.78 so patient had CT scan CTA chest was done to rule that out.  And it was negative.  Also CT of his abdomen.  Radiology wanted to go ahead and just use contrast on the abdomen.  No acute findings on the abdomen.  His labs as mentioned troponin spine D-dimer was elevated lipase normal at 48.  Liver function test without any significant abnormalities.  Electrolytes without significant abnormality renal function normal.  No leukocytosis no anemia.  CT angio chest no evidence of pulmonary embolus or any other acute  findings.  CT abdomen without any acute findings.  Patient follow-up with his gastroenterologist and continue his increased dose of his Prilosec.     Final Clinical Impression(s) / ED Diagnoses Final diagnoses:  Atypical chest pain  Epigastric pain    Rx / DC Orders ED Discharge Orders    None       Fredia Sorrow, MD 05/19/21 [phone]

## 2021-05-19 NOTE — ED Triage Notes (Signed)
Pt presents to ED with complaints of chest tightness into back and SOB x couple of days. Pt states he also woke up last night with burning in his abdomen about 0200, better this am.

## 2021-05-19 NOTE — ED Notes (Signed)
Patient transported to CT 

## 2021-07-23 DIAGNOSIS — E039 Hypothyroidism, unspecified: Secondary | ICD-10-CM | POA: Diagnosis not present

## 2021-07-23 DIAGNOSIS — L219 Seborrheic dermatitis, unspecified: Secondary | ICD-10-CM | POA: Diagnosis not present

## 2021-07-23 DIAGNOSIS — R21 Rash and other nonspecific skin eruption: Secondary | ICD-10-CM | POA: Diagnosis not present

## 2021-07-23 DIAGNOSIS — Z6830 Body mass index (BMI) 30.0-30.9, adult: Secondary | ICD-10-CM | POA: Diagnosis not present

## 2021-07-31 DIAGNOSIS — M329 Systemic lupus erythematosus, unspecified: Secondary | ICD-10-CM | POA: Diagnosis not present

## 2021-08-19 DIAGNOSIS — Z7951 Long term (current) use of inhaled steroids: Secondary | ICD-10-CM | POA: Diagnosis not present

## 2021-08-19 DIAGNOSIS — Z87891 Personal history of nicotine dependence: Secondary | ICD-10-CM | POA: Diagnosis not present

## 2021-08-19 DIAGNOSIS — Z833 Family history of diabetes mellitus: Secondary | ICD-10-CM | POA: Diagnosis not present

## 2021-08-19 DIAGNOSIS — L309 Dermatitis, unspecified: Secondary | ICD-10-CM | POA: Diagnosis not present

## 2021-08-19 DIAGNOSIS — Z8249 Family history of ischemic heart disease and other diseases of the circulatory system: Secondary | ICD-10-CM | POA: Diagnosis not present

## 2021-08-19 DIAGNOSIS — J45909 Unspecified asthma, uncomplicated: Secondary | ICD-10-CM | POA: Diagnosis not present

## 2021-08-19 DIAGNOSIS — E039 Hypothyroidism, unspecified: Secondary | ICD-10-CM | POA: Diagnosis not present

## 2021-08-19 DIAGNOSIS — K219 Gastro-esophageal reflux disease without esophagitis: Secondary | ICD-10-CM | POA: Diagnosis not present

## 2021-08-19 DIAGNOSIS — E785 Hyperlipidemia, unspecified: Secondary | ICD-10-CM | POA: Diagnosis not present

## 2021-08-30 DIAGNOSIS — Z20828 Contact with and (suspected) exposure to other viral communicable diseases: Secondary | ICD-10-CM | POA: Diagnosis not present

## 2021-09-22 DIAGNOSIS — Z23 Encounter for immunization: Secondary | ICD-10-CM | POA: Diagnosis not present

## 2021-09-24 DIAGNOSIS — Z6830 Body mass index (BMI) 30.0-30.9, adult: Secondary | ICD-10-CM | POA: Diagnosis not present

## 2021-09-24 DIAGNOSIS — R768 Other specified abnormal immunological findings in serum: Secondary | ICD-10-CM | POA: Diagnosis not present

## 2021-09-24 DIAGNOSIS — E669 Obesity, unspecified: Secondary | ICD-10-CM | POA: Diagnosis not present

## 2021-10-07 DIAGNOSIS — H5200 Hypermetropia, unspecified eye: Secondary | ICD-10-CM | POA: Diagnosis not present

## 2021-12-02 DIAGNOSIS — M26622 Arthralgia of left temporomandibular joint: Secondary | ICD-10-CM | POA: Diagnosis not present

## 2021-12-03 DIAGNOSIS — Z0100 Encounter for examination of eyes and vision without abnormal findings: Secondary | ICD-10-CM | POA: Diagnosis not present

## 2022-03-05 DIAGNOSIS — E039 Hypothyroidism, unspecified: Secondary | ICD-10-CM | POA: Diagnosis not present

## 2022-03-05 DIAGNOSIS — N400 Enlarged prostate without lower urinary tract symptoms: Secondary | ICD-10-CM | POA: Diagnosis not present

## 2022-03-05 DIAGNOSIS — R739 Hyperglycemia, unspecified: Secondary | ICD-10-CM | POA: Diagnosis not present

## 2022-03-05 DIAGNOSIS — K219 Gastro-esophageal reflux disease without esophagitis: Secondary | ICD-10-CM | POA: Diagnosis not present

## 2022-03-05 DIAGNOSIS — E7800 Pure hypercholesterolemia, unspecified: Secondary | ICD-10-CM | POA: Diagnosis not present

## 2022-03-05 DIAGNOSIS — E7801 Familial hypercholesterolemia: Secondary | ICD-10-CM | POA: Diagnosis not present

## 2022-03-05 DIAGNOSIS — R030 Elevated blood-pressure reading, without diagnosis of hypertension: Secondary | ICD-10-CM | POA: Diagnosis not present

## 2022-03-10 DIAGNOSIS — E039 Hypothyroidism, unspecified: Secondary | ICD-10-CM | POA: Diagnosis not present

## 2022-03-10 DIAGNOSIS — E7849 Other hyperlipidemia: Secondary | ICD-10-CM | POA: Diagnosis not present

## 2022-03-10 DIAGNOSIS — Z6829 Body mass index (BMI) 29.0-29.9, adult: Secondary | ICD-10-CM | POA: Diagnosis not present

## 2022-03-10 DIAGNOSIS — Z23 Encounter for immunization: Secondary | ICD-10-CM | POA: Diagnosis not present

## 2022-03-10 DIAGNOSIS — Z0001 Encounter for general adult medical examination with abnormal findings: Secondary | ICD-10-CM | POA: Diagnosis not present

## 2022-03-10 DIAGNOSIS — Z1389 Encounter for screening for other disorder: Secondary | ICD-10-CM | POA: Diagnosis not present

## 2022-03-10 DIAGNOSIS — Z1331 Encounter for screening for depression: Secondary | ICD-10-CM | POA: Diagnosis not present

## 2022-03-10 DIAGNOSIS — J4540 Moderate persistent asthma, uncomplicated: Secondary | ICD-10-CM | POA: Diagnosis not present

## 2022-05-05 DIAGNOSIS — L820 Inflamed seborrheic keratosis: Secondary | ICD-10-CM | POA: Diagnosis not present

## 2022-05-05 DIAGNOSIS — L821 Other seborrheic keratosis: Secondary | ICD-10-CM | POA: Diagnosis not present

## 2022-05-05 DIAGNOSIS — D485 Neoplasm of uncertain behavior of skin: Secondary | ICD-10-CM | POA: Diagnosis not present

## 2022-05-05 DIAGNOSIS — L570 Actinic keratosis: Secondary | ICD-10-CM | POA: Diagnosis not present

## 2022-05-11 DIAGNOSIS — D239 Other benign neoplasm of skin, unspecified: Secondary | ICD-10-CM | POA: Diagnosis not present

## 2022-05-11 DIAGNOSIS — D692 Other nonthrombocytopenic purpura: Secondary | ICD-10-CM | POA: Diagnosis not present

## 2022-06-10 DIAGNOSIS — Z23 Encounter for immunization: Secondary | ICD-10-CM | POA: Diagnosis not present

## 2022-09-07 DIAGNOSIS — E7849 Other hyperlipidemia: Secondary | ICD-10-CM | POA: Diagnosis not present

## 2022-09-07 DIAGNOSIS — R739 Hyperglycemia, unspecified: Secondary | ICD-10-CM | POA: Diagnosis not present

## 2022-09-10 DIAGNOSIS — K2100 Gastro-esophageal reflux disease with esophagitis, without bleeding: Secondary | ICD-10-CM | POA: Diagnosis not present

## 2022-09-10 DIAGNOSIS — Z6829 Body mass index (BMI) 29.0-29.9, adult: Secondary | ICD-10-CM | POA: Diagnosis not present

## 2022-09-10 DIAGNOSIS — E7849 Other hyperlipidemia: Secondary | ICD-10-CM | POA: Diagnosis not present

## 2022-09-10 DIAGNOSIS — Z23 Encounter for immunization: Secondary | ICD-10-CM | POA: Diagnosis not present

## 2022-09-10 DIAGNOSIS — E039 Hypothyroidism, unspecified: Secondary | ICD-10-CM | POA: Diagnosis not present

## 2022-09-10 DIAGNOSIS — J4540 Moderate persistent asthma, uncomplicated: Secondary | ICD-10-CM | POA: Diagnosis not present

## 2022-09-10 DIAGNOSIS — J301 Allergic rhinitis due to pollen: Secondary | ICD-10-CM | POA: Diagnosis not present

## 2022-09-17 DIAGNOSIS — Z23 Encounter for immunization: Secondary | ICD-10-CM | POA: Diagnosis not present

## 2022-09-27 DIAGNOSIS — R079 Chest pain, unspecified: Secondary | ICD-10-CM | POA: Diagnosis not present

## 2022-09-27 DIAGNOSIS — R0789 Other chest pain: Secondary | ICD-10-CM | POA: Diagnosis not present

## 2022-09-27 DIAGNOSIS — J209 Acute bronchitis, unspecified: Secondary | ICD-10-CM | POA: Diagnosis not present

## 2022-09-27 DIAGNOSIS — J40 Bronchitis, not specified as acute or chronic: Secondary | ICD-10-CM | POA: Diagnosis not present

## 2022-10-22 DIAGNOSIS — E039 Hypothyroidism, unspecified: Secondary | ICD-10-CM | POA: Diagnosis not present

## 2022-10-27 DIAGNOSIS — Z23 Encounter for immunization: Secondary | ICD-10-CM | POA: Diagnosis not present

## 2022-11-05 DIAGNOSIS — L570 Actinic keratosis: Secondary | ICD-10-CM | POA: Diagnosis not present

## 2022-11-24 DIAGNOSIS — K219 Gastro-esophageal reflux disease without esophagitis: Secondary | ICD-10-CM | POA: Diagnosis not present

## 2022-11-24 DIAGNOSIS — E039 Hypothyroidism, unspecified: Secondary | ICD-10-CM | POA: Diagnosis not present

## 2022-11-24 DIAGNOSIS — Z6828 Body mass index (BMI) 28.0-28.9, adult: Secondary | ICD-10-CM | POA: Diagnosis not present

## 2022-11-24 DIAGNOSIS — E669 Obesity, unspecified: Secondary | ICD-10-CM | POA: Diagnosis not present

## 2022-11-24 DIAGNOSIS — Z87891 Personal history of nicotine dependence: Secondary | ICD-10-CM | POA: Diagnosis not present

## 2022-11-24 DIAGNOSIS — Z8249 Family history of ischemic heart disease and other diseases of the circulatory system: Secondary | ICD-10-CM | POA: Diagnosis not present

## 2022-11-24 DIAGNOSIS — J45909 Unspecified asthma, uncomplicated: Secondary | ICD-10-CM | POA: Diagnosis not present

## 2022-11-24 DIAGNOSIS — Z7951 Long term (current) use of inhaled steroids: Secondary | ICD-10-CM | POA: Diagnosis not present

## 2022-11-24 DIAGNOSIS — E785 Hyperlipidemia, unspecified: Secondary | ICD-10-CM | POA: Diagnosis not present

## 2022-12-14 DIAGNOSIS — J101 Influenza due to other identified influenza virus with other respiratory manifestations: Secondary | ICD-10-CM | POA: Diagnosis not present

## 2022-12-14 DIAGNOSIS — R509 Fever, unspecified: Secondary | ICD-10-CM | POA: Diagnosis not present

## 2022-12-14 DIAGNOSIS — R030 Elevated blood-pressure reading, without diagnosis of hypertension: Secondary | ICD-10-CM | POA: Diagnosis not present

## 2022-12-14 DIAGNOSIS — R059 Cough, unspecified: Secondary | ICD-10-CM | POA: Diagnosis not present

## 2022-12-14 DIAGNOSIS — Z6828 Body mass index (BMI) 28.0-28.9, adult: Secondary | ICD-10-CM | POA: Diagnosis not present

## 2022-12-14 DIAGNOSIS — Z20828 Contact with and (suspected) exposure to other viral communicable diseases: Secondary | ICD-10-CM | POA: Diagnosis not present

## 2022-12-23 DIAGNOSIS — R030 Elevated blood-pressure reading, without diagnosis of hypertension: Secondary | ICD-10-CM | POA: Diagnosis not present

## 2022-12-23 DIAGNOSIS — Z6828 Body mass index (BMI) 28.0-28.9, adult: Secondary | ICD-10-CM | POA: Diagnosis not present

## 2022-12-23 DIAGNOSIS — J45901 Unspecified asthma with (acute) exacerbation: Secondary | ICD-10-CM | POA: Diagnosis not present

## 2022-12-23 DIAGNOSIS — J101 Influenza due to other identified influenza virus with other respiratory manifestations: Secondary | ICD-10-CM | POA: Diagnosis not present

## 2023-03-12 DIAGNOSIS — R739 Hyperglycemia, unspecified: Secondary | ICD-10-CM | POA: Diagnosis not present

## 2023-03-12 DIAGNOSIS — E7849 Other hyperlipidemia: Secondary | ICD-10-CM | POA: Diagnosis not present

## 2023-03-12 DIAGNOSIS — E7801 Familial hypercholesterolemia: Secondary | ICD-10-CM | POA: Diagnosis not present

## 2023-03-12 DIAGNOSIS — N400 Enlarged prostate without lower urinary tract symptoms: Secondary | ICD-10-CM | POA: Diagnosis not present

## 2023-03-12 DIAGNOSIS — K2100 Gastro-esophageal reflux disease with esophagitis, without bleeding: Secondary | ICD-10-CM | POA: Diagnosis not present

## 2023-03-12 DIAGNOSIS — E039 Hypothyroidism, unspecified: Secondary | ICD-10-CM | POA: Diagnosis not present

## 2023-03-17 DIAGNOSIS — J4540 Moderate persistent asthma, uncomplicated: Secondary | ICD-10-CM | POA: Diagnosis not present

## 2023-03-17 DIAGNOSIS — Z6828 Body mass index (BMI) 28.0-28.9, adult: Secondary | ICD-10-CM | POA: Diagnosis not present

## 2023-03-17 DIAGNOSIS — Z1389 Encounter for screening for other disorder: Secondary | ICD-10-CM | POA: Diagnosis not present

## 2023-03-17 DIAGNOSIS — J301 Allergic rhinitis due to pollen: Secondary | ICD-10-CM | POA: Diagnosis not present

## 2023-03-17 DIAGNOSIS — K2100 Gastro-esophageal reflux disease with esophagitis, without bleeding: Secondary | ICD-10-CM | POA: Diagnosis not present

## 2023-03-17 DIAGNOSIS — R030 Elevated blood-pressure reading, without diagnosis of hypertension: Secondary | ICD-10-CM | POA: Diagnosis not present

## 2023-03-17 DIAGNOSIS — E039 Hypothyroidism, unspecified: Secondary | ICD-10-CM | POA: Diagnosis not present

## 2023-03-17 DIAGNOSIS — Z0001 Encounter for general adult medical examination with abnormal findings: Secondary | ICD-10-CM | POA: Diagnosis not present

## 2023-03-17 DIAGNOSIS — Z1331 Encounter for screening for depression: Secondary | ICD-10-CM | POA: Diagnosis not present

## 2023-03-17 DIAGNOSIS — E7849 Other hyperlipidemia: Secondary | ICD-10-CM | POA: Diagnosis not present

## 2023-03-22 DIAGNOSIS — R030 Elevated blood-pressure reading, without diagnosis of hypertension: Secondary | ICD-10-CM | POA: Diagnosis not present

## 2023-03-22 DIAGNOSIS — E7849 Other hyperlipidemia: Secondary | ICD-10-CM | POA: Diagnosis not present

## 2023-03-22 DIAGNOSIS — Z0001 Encounter for general adult medical examination with abnormal findings: Secondary | ICD-10-CM | POA: Diagnosis not present

## 2023-03-22 DIAGNOSIS — M329 Systemic lupus erythematosus, unspecified: Secondary | ICD-10-CM | POA: Diagnosis not present

## 2023-03-22 DIAGNOSIS — E039 Hypothyroidism, unspecified: Secondary | ICD-10-CM | POA: Diagnosis not present

## 2023-04-28 IMAGING — CT CT ANGIO CHEST
2 of 7 series · 18 of 46 positions shown · IV contrast (Omnipaque or Isovue)
Comparison: None.

CLINICAL DATA: PE suspected, low/intermediate prob, positive
D-dimer

EXAM:
CT ANGIOGRAPHY CHEST WITH CONTRAST
TECHNIQUE: Multidetector CT imaging of the chest was performed using the
standard protocol during bolus administration of intravenous
contrast. Multiplanar CT image reconstructions and MIPs were
obtained to evaluate the vascular anatomy.
CONTRAST:  100mL OMNIPAQUE IOHEXOL 350 MG/ML SOLN

[Series 5: pe axial thins · axial · 0.82mm/px · z∈[+1281,+1538]mm · 15 of 363 slices shown]
[im 21/363  lung]
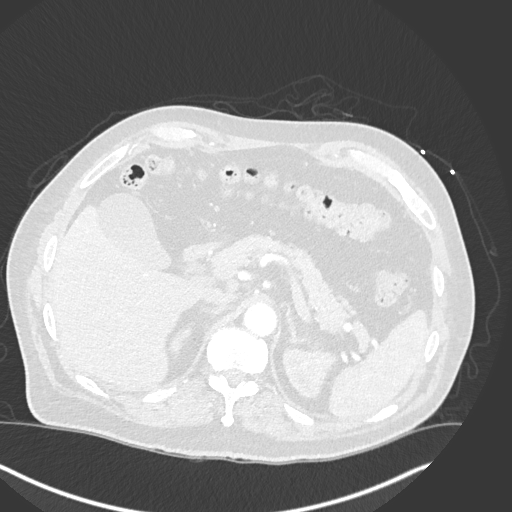
[im 41/363  soft-tissue]
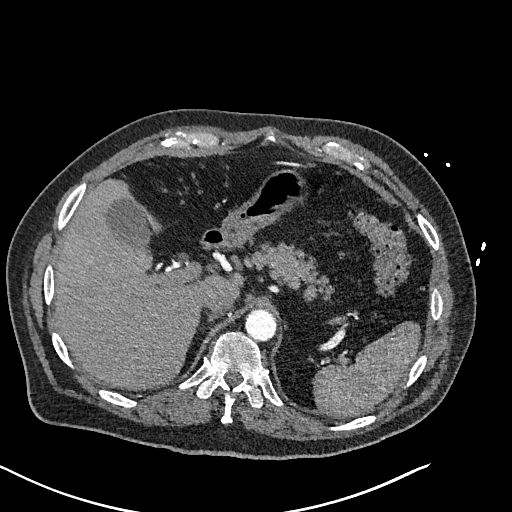
[im 61/363  lung]
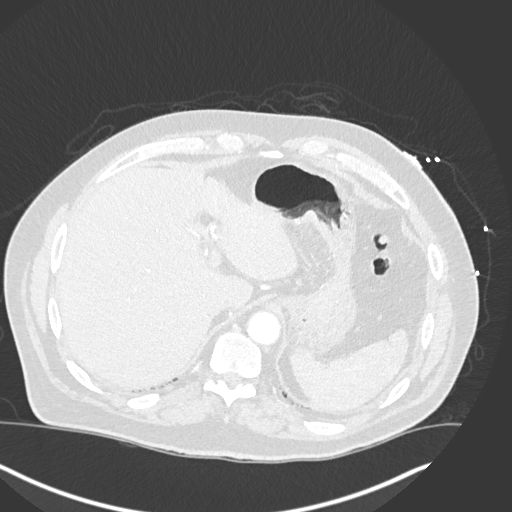
[im 81/363  soft-tissue]
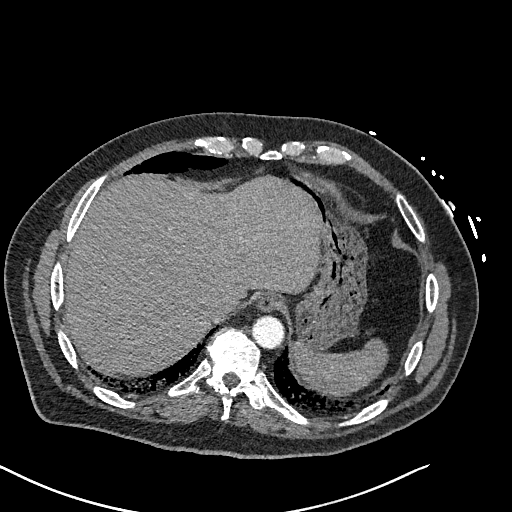
[im 121/363  lung]
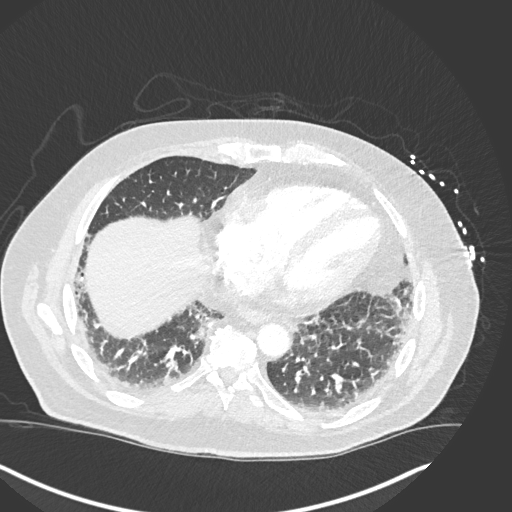
[im 141/363  soft-tissue]
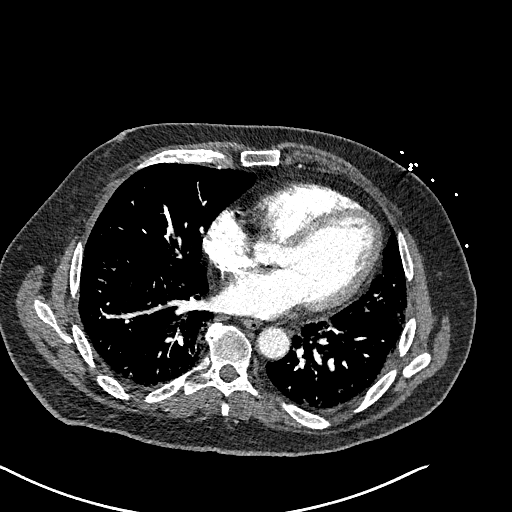
[im 161/363  lung]
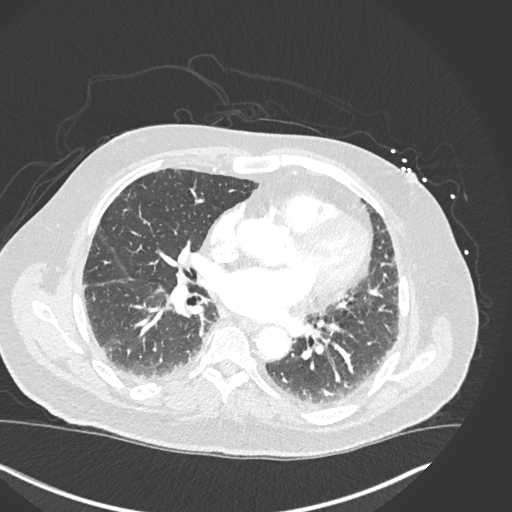
[im 182/363  soft-tissue]
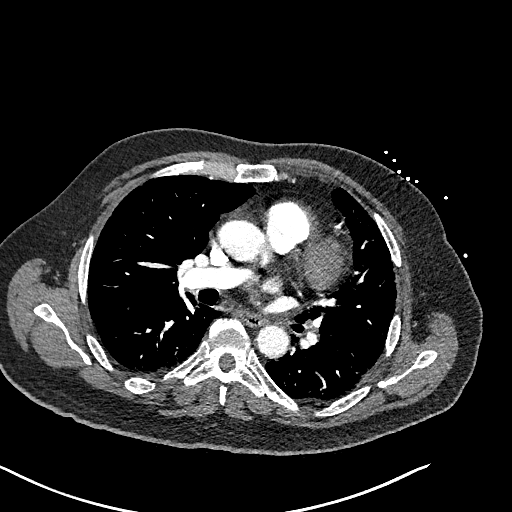
[im 202/363  lung]
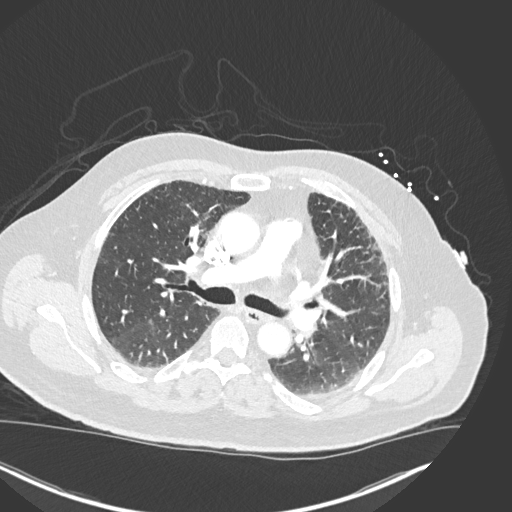
[im 222/363  soft-tissue]
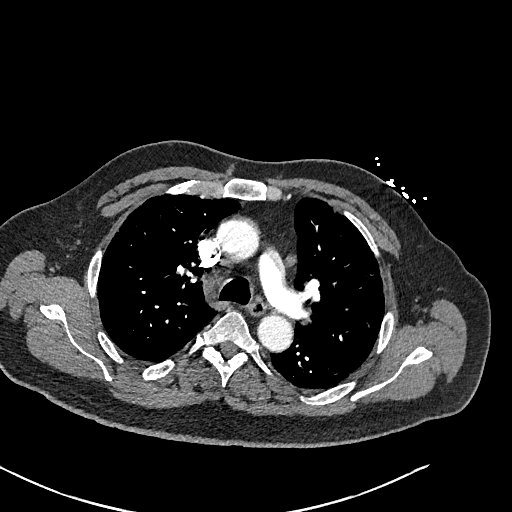
[im 242/363  lung]
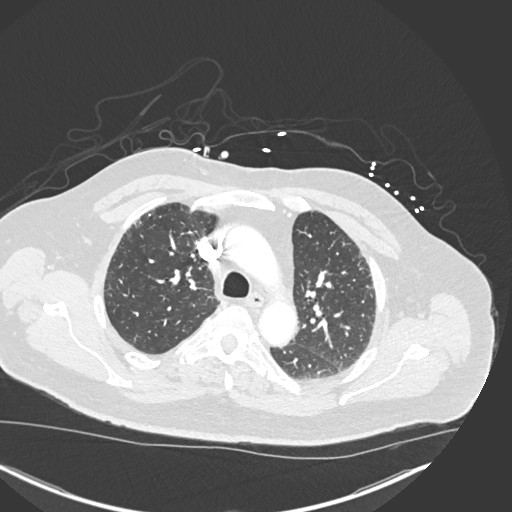
[im 282/363  soft-tissue]
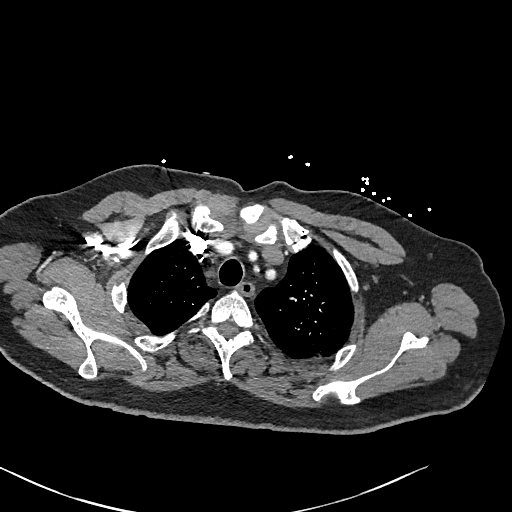
[im 302/363  lung]
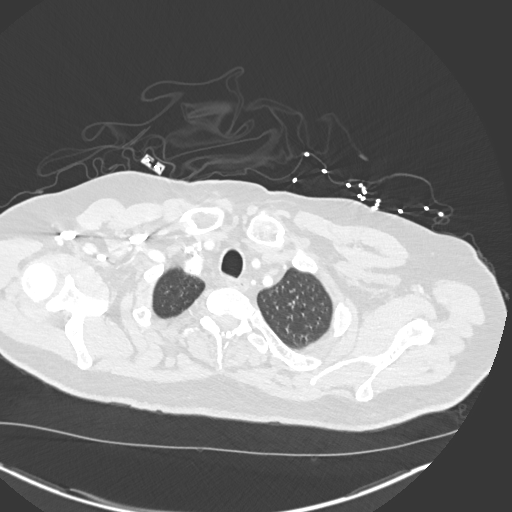
[im 322/363  soft-tissue]
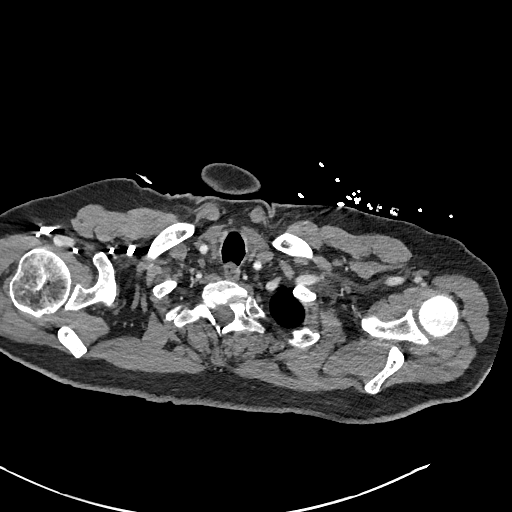
[im 342/363  lung]
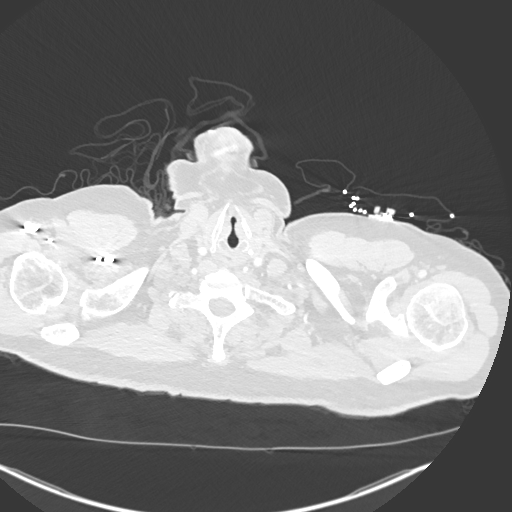

[Series 7: cor soft · coronal · 0.59mm/px · 3 of 146 slices shown]
[im 37/146  soft-tissue]
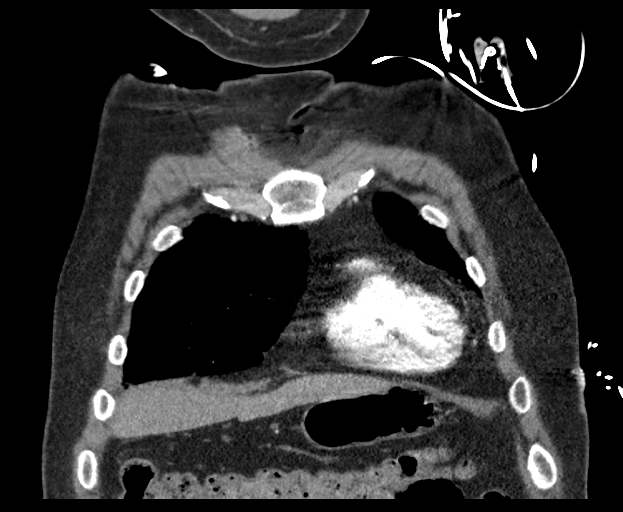
[im 73/146  soft-tissue]
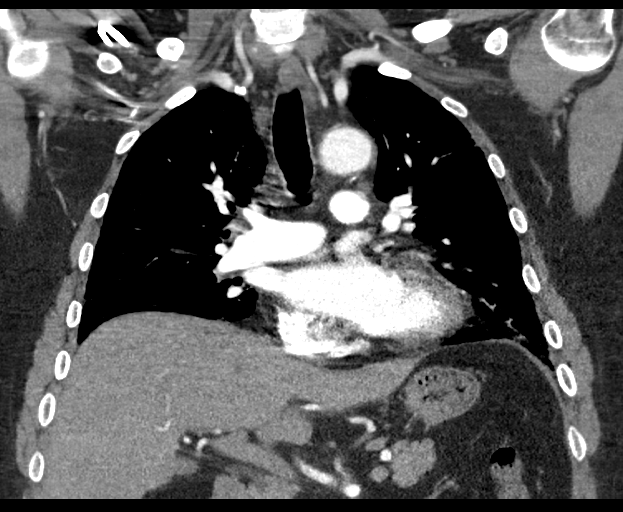
[im 109/146  soft-tissue]
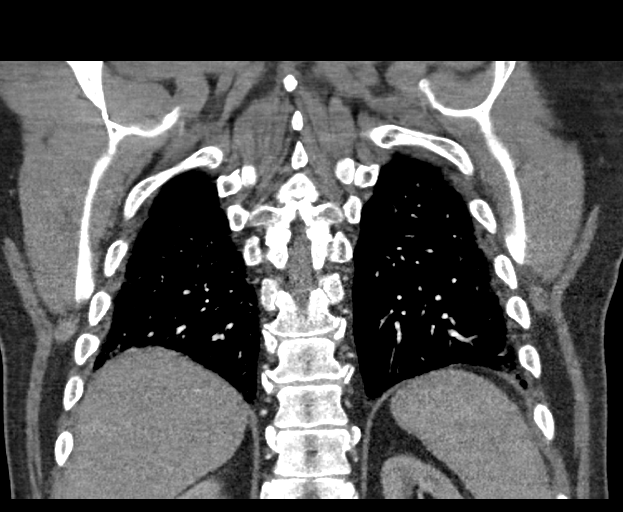

[18 of 46 positions shown; findings below may reference images not displayed]

FINDINGS: Cardiovascular: Satisfactory opacification of the pulmonary arteries
to the segmental level. No evidence of pulmonary embolism. Normal
heart size. No pericardial effusion.

Mediastinum/Nodes: No enlarged mediastinal, hilar, or axillary lymph
nodes. Atrophic thyroid. Unremarkable trachea and esophagus
demonstrate no significant findings.

Lungs/Pleura: There is bilateral, subpleural reticulation with mild
bronchiolectasis in the costophrenic sulci. There are no suspicious
pulmonary nodules or masses. There is no focal airspace
consolidation.

Upper Abdomen: Please see separately dictated CT of the abdomen
pelvis for findings below the diaphragm.

Musculoskeletal: No suspicious lytic or blastic lesions. No acute
osseous abnormality. Mild thoracic spondylosis.

Review of the MIP images confirms the above findings.
IMPRESSION: No evidence of pulmonary embolism or other acute findings in the
chest.

Diffuse subpleural interstitial lung abnormality with evidence of
mild fibrosis in the costophrenic sulci. Recommend pulmonology
referral.

See separately dictated CT of the abdomen and pelvis for findings
below the diaphragm.

## 2023-05-05 DIAGNOSIS — Z1283 Encounter for screening for malignant neoplasm of skin: Secondary | ICD-10-CM | POA: Diagnosis not present

## 2023-05-05 DIAGNOSIS — L570 Actinic keratosis: Secondary | ICD-10-CM | POA: Diagnosis not present

## 2023-05-05 DIAGNOSIS — D485 Neoplasm of uncertain behavior of skin: Secondary | ICD-10-CM | POA: Diagnosis not present

## 2023-05-17 DIAGNOSIS — E039 Hypothyroidism, unspecified: Secondary | ICD-10-CM | POA: Diagnosis not present

## 2023-07-20 DIAGNOSIS — I129 Hypertensive chronic kidney disease with stage 1 through stage 4 chronic kidney disease, or unspecified chronic kidney disease: Secondary | ICD-10-CM | POA: Diagnosis not present

## 2023-07-20 DIAGNOSIS — M1990 Unspecified osteoarthritis, unspecified site: Secondary | ICD-10-CM | POA: Diagnosis not present

## 2023-07-20 DIAGNOSIS — N181 Chronic kidney disease, stage 1: Secondary | ICD-10-CM | POA: Diagnosis not present

## 2023-07-20 DIAGNOSIS — I700 Atherosclerosis of aorta: Secondary | ICD-10-CM | POA: Diagnosis not present

## 2023-07-20 DIAGNOSIS — E785 Hyperlipidemia, unspecified: Secondary | ICD-10-CM | POA: Diagnosis not present

## 2023-07-20 DIAGNOSIS — K219 Gastro-esophageal reflux disease without esophagitis: Secondary | ICD-10-CM | POA: Diagnosis not present

## 2023-07-20 DIAGNOSIS — Z66 Do not resuscitate: Secondary | ICD-10-CM | POA: Diagnosis not present

## 2023-07-20 DIAGNOSIS — Z87891 Personal history of nicotine dependence: Secondary | ICD-10-CM | POA: Diagnosis not present

## 2023-07-20 DIAGNOSIS — E039 Hypothyroidism, unspecified: Secondary | ICD-10-CM | POA: Diagnosis not present

## 2023-07-20 DIAGNOSIS — J45909 Unspecified asthma, uncomplicated: Secondary | ICD-10-CM | POA: Diagnosis not present

## 2023-07-20 DIAGNOSIS — Z7951 Long term (current) use of inhaled steroids: Secondary | ICD-10-CM | POA: Diagnosis not present

## 2023-07-20 DIAGNOSIS — Z008 Encounter for other general examination: Secondary | ICD-10-CM | POA: Diagnosis not present

## 2023-07-20 DIAGNOSIS — J8410 Pulmonary fibrosis, unspecified: Secondary | ICD-10-CM | POA: Diagnosis not present

## 2023-09-06 DIAGNOSIS — E039 Hypothyroidism, unspecified: Secondary | ICD-10-CM | POA: Diagnosis not present

## 2023-09-06 DIAGNOSIS — R739 Hyperglycemia, unspecified: Secondary | ICD-10-CM | POA: Diagnosis not present

## 2023-09-06 DIAGNOSIS — E7849 Other hyperlipidemia: Secondary | ICD-10-CM | POA: Diagnosis not present

## 2023-09-13 DIAGNOSIS — J4540 Moderate persistent asthma, uncomplicated: Secondary | ICD-10-CM | POA: Diagnosis not present

## 2023-09-13 DIAGNOSIS — Z23 Encounter for immunization: Secondary | ICD-10-CM | POA: Diagnosis not present

## 2023-09-13 DIAGNOSIS — Z6828 Body mass index (BMI) 28.0-28.9, adult: Secondary | ICD-10-CM | POA: Diagnosis not present

## 2023-09-13 DIAGNOSIS — K2100 Gastro-esophageal reflux disease with esophagitis, without bleeding: Secondary | ICD-10-CM | POA: Diagnosis not present

## 2023-09-13 DIAGNOSIS — R030 Elevated blood-pressure reading, without diagnosis of hypertension: Secondary | ICD-10-CM | POA: Diagnosis not present

## 2023-09-13 DIAGNOSIS — J301 Allergic rhinitis due to pollen: Secondary | ICD-10-CM | POA: Diagnosis not present

## 2023-09-13 DIAGNOSIS — E039 Hypothyroidism, unspecified: Secondary | ICD-10-CM | POA: Diagnosis not present

## 2023-09-13 DIAGNOSIS — E7849 Other hyperlipidemia: Secondary | ICD-10-CM | POA: Diagnosis not present

## 2023-09-16 DIAGNOSIS — Z23 Encounter for immunization: Secondary | ICD-10-CM | POA: Diagnosis not present

## 2023-10-11 DIAGNOSIS — H5203 Hypermetropia, bilateral: Secondary | ICD-10-CM | POA: Diagnosis not present

## 2023-11-01 DIAGNOSIS — Z6829 Body mass index (BMI) 29.0-29.9, adult: Secondary | ICD-10-CM | POA: Diagnosis not present

## 2023-11-01 DIAGNOSIS — R030 Elevated blood-pressure reading, without diagnosis of hypertension: Secondary | ICD-10-CM | POA: Diagnosis not present

## 2023-11-01 DIAGNOSIS — R490 Dysphonia: Secondary | ICD-10-CM | POA: Diagnosis not present

## 2023-11-01 DIAGNOSIS — K219 Gastro-esophageal reflux disease without esophagitis: Secondary | ICD-10-CM | POA: Diagnosis not present

## 2024-05-03 DIAGNOSIS — D485 Neoplasm of uncertain behavior of skin: Secondary | ICD-10-CM | POA: Diagnosis not present

## 2024-05-03 DIAGNOSIS — L814 Other melanin hyperpigmentation: Secondary | ICD-10-CM | POA: Diagnosis not present

## 2024-05-03 DIAGNOSIS — L821 Other seborrheic keratosis: Secondary | ICD-10-CM | POA: Diagnosis not present

## 2024-05-03 DIAGNOSIS — L570 Actinic keratosis: Secondary | ICD-10-CM | POA: Diagnosis not present

## 2024-09-04 ENCOUNTER — Encounter (HOSPITAL_COMMUNITY): Payer: Self-pay

## 2024-09-04 ENCOUNTER — Other Ambulatory Visit: Payer: Self-pay

## 2024-09-04 ENCOUNTER — Emergency Department (HOSPITAL_COMMUNITY)

## 2024-09-04 ENCOUNTER — Observation Stay (HOSPITAL_COMMUNITY)
Admission: EM | Admit: 2024-09-04 | Discharge: 2024-09-05 | Disposition: A | Discharge status: Home / Self Care | Attending: Internal Medicine | Admitting: Internal Medicine

## 2024-09-04 DIAGNOSIS — K5730 Diverticulosis of large intestine without perforation or abscess without bleeding: Secondary | ICD-10-CM | POA: Diagnosis not present

## 2024-09-04 DIAGNOSIS — J4520 Mild intermittent asthma, uncomplicated: Secondary | ICD-10-CM | POA: Diagnosis not present

## 2024-09-04 DIAGNOSIS — K219 Gastro-esophageal reflux disease without esophagitis: Secondary | ICD-10-CM | POA: Insufficient documentation

## 2024-09-04 DIAGNOSIS — R109 Unspecified abdominal pain: Secondary | ICD-10-CM | POA: Diagnosis not present

## 2024-09-04 DIAGNOSIS — J45909 Unspecified asthma, uncomplicated: Secondary | ICD-10-CM | POA: Insufficient documentation

## 2024-09-04 DIAGNOSIS — E039 Hypothyroidism, unspecified: Secondary | ICD-10-CM | POA: Diagnosis not present

## 2024-09-04 DIAGNOSIS — K567 Ileus, unspecified: Secondary | ICD-10-CM | POA: Diagnosis present

## 2024-09-04 DIAGNOSIS — K56609 Unspecified intestinal obstruction, unspecified as to partial versus complete obstruction: Principal | ICD-10-CM | POA: Diagnosis present

## 2024-09-04 DIAGNOSIS — E782 Mixed hyperlipidemia: Secondary | ICD-10-CM | POA: Insufficient documentation

## 2024-09-04 DIAGNOSIS — Z79899 Other long term (current) drug therapy: Secondary | ICD-10-CM | POA: Diagnosis not present

## 2024-09-04 DIAGNOSIS — Z87891 Personal history of nicotine dependence: Secondary | ICD-10-CM | POA: Diagnosis not present

## 2024-09-04 DIAGNOSIS — R1013 Epigastric pain: Secondary | ICD-10-CM | POA: Diagnosis not present

## 2024-09-04 LAB — URINALYSIS, ROUTINE W REFLEX MICROSCOPIC
Bilirubin Urine: NEGATIVE
Glucose, UA: NEGATIVE mg/dL
Hgb urine dipstick: NEGATIVE
Ketones, ur: NEGATIVE mg/dL
Leukocytes,Ua: NEGATIVE
Nitrite: NEGATIVE
Protein, ur: NEGATIVE mg/dL
Specific Gravity, Urine: 1.016 (ref 1.005–1.030)
pH: 5 (ref 5.0–8.0)

## 2024-09-04 LAB — CBC
HCT: 46.4 % (ref 39.0–52.0)
Hemoglobin: 15.9 g/dL (ref 13.0–17.0)
MCH: 31.4 pg (ref 26.0–34.0)
MCHC: 34.3 g/dL (ref 30.0–36.0)
MCV: 91.5 fL (ref 80.0–100.0)
Platelets: 255 K/uL (ref 150–400)
RBC: 5.07 MIL/uL (ref 4.22–5.81)
RDW: 12.1 % (ref 11.5–15.5)
WBC: 11.5 K/uL — ABNORMAL HIGH (ref 4.0–10.5)
nRBC: 0 % (ref 0.0–0.2)

## 2024-09-04 LAB — LACTIC ACID, PLASMA: Lactic Acid, Venous: 1 mmol/L (ref 0.5–1.9)

## 2024-09-04 LAB — COMPREHENSIVE METABOLIC PANEL WITH GFR
ALT: 36 U/L (ref 0–44)
AST: 37 U/L (ref 15–41)
Albumin: 4 g/dL (ref 3.5–5.0)
Alkaline Phosphatase: 64 U/L (ref 38–126)
Anion gap: 10 (ref 5–15)
BUN: 11 mg/dL (ref 8–23)
CO2: 23 mmol/L (ref 22–32)
Calcium: 10.3 mg/dL (ref 8.9–10.3)
Chloride: 101 mmol/L (ref 98–111)
Creatinine, Ser: 0.81 mg/dL (ref 0.61–1.24)
GFR, Estimated: >60 mL/min (ref >60)
Glucose, Bld: 134 mg/dL — ABNORMAL HIGH (ref 70–99)
Potassium: 3.9 mmol/L (ref 3.5–5.1)
Sodium: 134 mmol/L — ABNORMAL LOW (ref 135–145)
Total Bilirubin: 0.9 mg/dL (ref 0.0–1.2)
Total Protein: 7.4 g/dL (ref 6.5–8.1)

## 2024-09-04 LAB — LIPASE, BLOOD: Lipase: 50 U/L (ref 11–51)

## 2024-09-04 MED ORDER — IOHEXOL 300 MG/ML  SOLN
100.0000 mL | Freq: Once | INTRAMUSCULAR | Status: AC | PRN
  Administered 2024-09-04: 100 mL via INTRAVENOUS

## 2024-09-04 MED ORDER — ALUM & MAG HYDROXIDE-SIMETH 200-200-20 MG/5ML PO SUSP
30.0000 mL | Freq: Once | ORAL | Status: AC
  Administered 2024-09-04: 30 mL via ORAL
  Filled 2024-09-04: qty 30

## 2024-09-04 MED ORDER — LIDOCAINE VISCOUS HCL 2 % MT SOLN
15.0000 mL | Freq: Once | OROMUCOSAL | Status: AC
  Administered 2024-09-04: 15 mL via ORAL
  Filled 2024-09-04: qty 15

## 2024-09-04 NOTE — ED Notes (Signed)
 Patient transported to CT

## 2024-09-04 NOTE — ED Provider Notes (Signed)
 [place] [facility] AT [facility] Provider Note   CSN: [id number] Arrival date & time: 09/04/24  8071     Patient presents with: Abdominal Pain   Chad Curtis is a 68 y.o. male with past medical history significant for hyperlipidemia, asthma, who presents with concern for intermittent abdominal pain starting this morning after eating breakfast.  Patient reports that he feels like if he threw up he would feel better.  He denies significant worsening after pain.  He reports that he has not really had an appetite.  He reports it comes and goes, 9/10, and sharp when it does arrive.  No fever, no chills, he does endorse several episodes of loose diarrhea this morning.  Some contact with grandchildren who recently started elementary school, but none of them have been sick.    Abdominal Pain      Prior to Admission medications   Medication Sig Start Date End Date Taking? Authorizing Provider  albuterol (VENTOLIN HFA) 108 (90 Base) MCG/ACT inhaler Inhale 2 puffs into the lungs 4 (four) times daily. 12/15/20   [provider]  azelastine (ASTELIN) 0.1 % nasal spray Place 1 spray into both nostrils at bedtime as needed for rhinitis or allergies. Use in each nostril as directed    [provider]  Fluticasone -Salmeterol (ADVAIR) 100-50 MCG/DOSE AEPB Inhale 1 puff into the lungs 2 (two) times daily.    [provider]  levothyroxine  (SYNTHROID , LEVOTHROID) 150 MCG tablet Take 150 mcg by mouth daily before breakfast.    [provider]  loratadine (CLARITIN) 10 MG tablet Take 10 mg by mouth daily as needed for allergies.    [provider]  mometasone (NASONEX) 50 MCG/ACT nasal spray Place 2 sprays into the nose daily as needed (congestion).    [provider]  omeprazole (PRILOSEC) 20 MG capsule Take by mouth 2 (two) times daily. 11/25/20   [provider]  simvastatin  (ZOCOR ) 20 MG tablet Take 20 mg by mouth at  bedtime.     [provider]    Allergies: Penicillins    Review of Systems  Gastrointestinal:  Positive for abdominal pain.  All other systems reviewed and are negative.   Updated Vital Signs BP 139/84   Pulse 75   Temp 98.2 F (36.8 C) (Oral)   Resp 17   Ht 6' (1.829 m)   Wt 93 kg   SpO2 95%   BMI 27.80 kg/m   Physical Exam Vitals and nursing note reviewed.  Constitutional:      General: He is not in acute distress.    Appearance: Normal appearance.  HENT:     Head: Normocephalic and atraumatic.  Eyes:     General:        Right eye: No discharge.        Left eye: No discharge.  Cardiovascular:     Rate and Rhythm: Normal rate and regular rhythm.     Heart sounds: No murmur heard.    No friction rub. No gallop.  Pulmonary:     Effort: Pulmonary effort is normal.     Breath sounds: Normal breath sounds.  Abdominal:     General: Bowel sounds are normal.     Palpations: Abdomen is soft.     Comments: Some tenderness to palpation with no rebound, rigidity, guarding in the epigastric region, no focal right upper quadrant tenderness, negative Murphy sign.  No significant lower abdominal tenderness.  Skin:    General: Skin is  warm and dry.     Capillary Refill: Capillary refill takes less than 2 seconds.  Neurological:     Mental Status: He is alert and oriented to person, place, and time.  Psychiatric:        Mood and Affect: Mood normal.        Behavior: Behavior normal.     (all labs ordered are listed, but only abnormal results are displayed) Labs Reviewed  COMPREHENSIVE METABOLIC PANEL WITH GFR - Abnormal; Notable for the following components:      Result Value   Sodium 134 (*)    Glucose, Bld 134 (*)    All other components within normal limits  CBC - Abnormal; Notable for the following components:   WBC 11.5 (*)    All other components within normal limits  LIPASE, BLOOD  URINALYSIS, ROUTINE W REFLEX MICROSCOPIC  LACTIC ACID, PLASMA     EKG: None  Radiology: CT ABDOMEN PELVIS W CONTRAST Result Date: 09/04/2024 CLINICAL DATA:  Abdominal pain EXAM: CT ABDOMEN AND PELVIS WITH CONTRAST TECHNIQUE: Multidetector CT imaging of the abdomen and pelvis was performed using the standard protocol following bolus administration of intravenous contrast. RADIATION DOSE REDUCTION: This exam was performed according to the departmental dose-optimization program which includes automated exposure control, adjustment of the mA and/or kV according to patient size and/or use of iterative reconstruction technique. CONTRAST:  OMNIPAQUE  IOHEXOL  300 MG/ML  SOLN COMPARISON:  05/19/2021 FINDINGS: Lower chest: Fibrosis in the lung bases.  No acute findings. Hepatobiliary: No focal hepatic abnormality. Gallbladder unremarkable. Pancreas: No focal abnormality or ductal dilatation. Spleen: No focal abnormality.  Normal size. Adrenals/Urinary Tract: No adrenal abnormality. No focal renal abnormality. No stones or hydronephrosis. Urinary bladder is unremarkable. Stomach/Bowel: Normal appendix. Dilated fluid-filled stomach and small bowel. Small bowel dilated into the pelvis. Distal small bowel decompressed. Findings compatible with distal small bowel obstruction. Exact transition not visualized. Left colonic diverticulosis. No active diverticulitis. Vascular/Lymphatic: Aortic atherosclerosis. No evidence of aneurysm or adenopathy. Reproductive: No visible focal abnormality. Other: Trace free fluid in the cul-de-sac.  No free air. Musculoskeletal: No acute bony abnormality. IMPRESSION: Dilated fluid-filled stomach and small bowel with distal small bowel decompressed. Findings compatible with distal small bowel obstruction. Left colonic diverticulosis.  No active diverticulitis. Aortic atherosclerosis. Electronically Signed   By: Franky Crease M.D.   On: 09/04/2024 22:26     Procedures   Medications Ordered in the ED  alum & mag hydroxide-simeth (MAALOX/MYLANTA)  200-200-20 MG/5ML suspension 30 mL (30 mLs Oral Given 09/04/24 2101)    And  lidocaine  (XYLOCAINE ) 2 % viscous mouth solution 15 mL (15 mLs Oral Given 09/04/24 2100)  iohexol  (OMNIPAQUE ) 300 MG/ML solution 100 mL (100 mLs Intravenous Contrast Given 09/04/24 2207)    Clinical Course as of 09/04/24 2307  Mon Sep 04, 2024  2040 seen [LS]  2240 Mavis surgery [CP]    Clinical Course User Index [CP] Rosan Sherlean DEL, PA-C [LS] Rogelia Jerilynn RAMAN, MD                                 Medical Decision Making Amount and/or Complexity of Data Reviewed Labs: ordered. Radiology: ordered.  Risk OTC drugs. Prescription drug management.   This patient is a 68 y.o. male  who presents to the ED for concern of abdominal pain, nausea, diarrhea.   Differential diagnoses prior to evaluation: The emergent differential diagnosis includes, but is not limited  to,  The causes of generalized abdominal pain include but are not limited to AAA, mesenteric ischemia, appendicitis, diverticulitis, DKA, gastritis, gastroenteritis, AMI, nephrolithiasis, pancreatitis, peritonitis, adrenal insufficiency,lead poisoning, iron toxicity, intestinal ischemia, constipation, UTI,SBO/LBO, splenic rupture, biliary disease, IBD, IBS, PUD, or hepatitis . This is not an exhaustive differential.   Past Medical History / Co-morbidities / Social History:  hyperlipidemia, asthma  Physical Exam: Physical exam performed. The pertinent findings include: Vital signs stable, some tenderness to palpation in the epigastric region without focal right upper quadrant tenderness.  Lab Tests/Imaging studies: I personally interpreted labs/imaging and the pertinent results include: CBC with mild leukocytosis, blood cells 11.5.  CMP overall unremarkable.  Very mild hyponatremia, sodium 134.  Normal lactic acid, UA unremarkable.  CT abdomen pelvis with contrast shows dilated stomach, upper esophagus, decompressed small bowel, consistent with  probable small bowel obstruction.. I agree with the radiologist interpretation.  Cardiac monitoring: EKG obtained and interpreted by myself and attending physician which shows: Normal sinus rhythm, no acute ST-T changes   Medications: I ordered medication including GI cocktail for epigastric abdominal pain, on reevaluation patient feels somewhat improved but still having intermittent cramping, pain.  I have reviewed the patients home medicines and have made adjustments as needed.  Consults: I spoke with the general surgeon, Dr. Mavis who agrees to consult on the patient for possible small bowel obstruction.  Spoke with hospitalist, Dr. Adefeso who agrees with plan for medicine admission.   Disposition: After consideration of the diagnostic results and the patients response to treatment, I feel that patient would benefit from hospital admission as discussed above.     Final diagnoses:  None    ED Discharge Orders     None          Rosan Sherlean VEAR DEVONNA 09/04/24 2307    Rogelia Jerilynn RAMAN, MD 09/05/24 0158    Rogelia Jerilynn RAMAN, MD 09/05/24 [phone]

## 2024-09-04 NOTE — ED Notes (Signed)
 NG tube placement attempted x3. Unsuccessful advancement. Nares became irritated and blood observed on NG tube. PA C Prosperi made aware.

## 2024-09-04 NOTE — H&P (Signed)
 History and Physical    Patient: Chad Curtis FMW:[id number] DOB: [birth year] DOA: 09/04/2024 DOS: the patient was seen and examined on 09/05/2024 PCP: Kayla Drivers, MD  Patient coming from: Home  Chief Complaint:  Chief Complaint  Patient presents with   Abdominal Pain   HPI: Chad Curtis is a 68 y.o. male with medical history significant of asthma, hypothyroidism, hyperlipidemia who presents to the emergency department due to abdominal pain which started this morning after breakfast.  Pain was generalized, intermittent and rated as 9/10 (maximum) on pain scale, this was associated with nausea without vomiting.  Patient endorsed 2-3 loose bowel movements prior to onset of symptoms this morning.  ED Course: In the emergency department, he was hemodynamically stable.  Workup in the ED showed normal CBC except for WBC of 11.5, BMP was normal except for sodium of 124 and blood glucose of 134.  Lipase 50, lactic acid was normal, urinalysis was normal. CT abdomen and pelvis with contrast showed Dilated fluid-filled stomach and small bowel with distal small bowel decompressed. Findings compatible with distal small bowel obstruction. GI cocktail was provided.  General surgery (Dr. Mavis) was consulted and will follow-up with patient in the morning per ED PA.  Review of Systems: Review of systems as noted in the HPI. All other systems reviewed and are negative.   Past Medical History:  Diagnosis Date   Asthma    Hypercholesteremia    Hypothyroidism    Shortness of breath dyspnea    Past Surgical History:  Procedure Laterality Date   COLONOSCOPY N/A 01/24/2015   Procedure: COLONOSCOPY;  Surgeon: Claudis RAYMOND Rivet, MD;  Location: [facility];  Service: Endoscopy;  Laterality: N/A;  830   COLONOSCOPY N/A 05/16/2020   Procedure: COLONOSCOPY;  Surgeon: Rivet Claudis RAYMOND, MD;  Location: [facility];  Service: Endoscopy;  Laterality: N/A;  830   FLEXIBLE SIGMOIDOSCOPY  1996   NASAL SINUS  SURGERY      Social History:  reports that he has quit smoking. His smoking use included cigarettes. He has a 2.5 pack-year smoking history. He has never used smokeless tobacco. He reports that he does not drink alcohol  and does not use drugs.   Allergies  Allergen Reactions   Penicillins Other (See Comments)    Does not know- was young    History reviewed. No pertinent family history.   Prior to Admission medications   Medication Sig Start Date End Date Taking? Authorizing Provider  albuterol (VENTOLIN HFA) 108 (90 Base) MCG/ACT inhaler Inhale 2 puffs into the lungs 4 (four) times daily. 12/15/20   [provider]  azelastine (ASTELIN) 0.1 % nasal spray Place 1 spray into both nostrils at bedtime as needed for rhinitis or allergies. Use in each nostril as directed    [provider]  Fluticasone -Salmeterol (ADVAIR) 100-50 MCG/DOSE AEPB Inhale 1 puff into the lungs 2 (two) times daily.    [provider]  levothyroxine  (SYNTHROID , LEVOTHROID) 150 MCG tablet Take 150 mcg by mouth daily before breakfast.    [provider]  loratadine (CLARITIN) 10 MG tablet Take 10 mg by mouth daily as needed for allergies.    [provider]  mometasone (NASONEX) 50 MCG/ACT nasal spray Place 2 sprays into the nose daily as needed (congestion).    [provider]  omeprazole (PRILOSEC) 20 MG capsule Take by mouth 2 (two) times daily. 11/25/20   [provider]  simvastatin  (ZOCOR ) 20 MG tablet Take 20 mg by mouth  at bedtime.     [provider]    Physical Exam: BP 139/84   Pulse 75   Temp (!) 97.5 F (36.4 C) (Oral)   Resp 17   Ht 6' (1.829 m)   Wt 93 kg   SpO2 95%   BMI 27.80 kg/m   General: 68 y.o. year-old male well developed well nourished in no acute distress.  Alert and oriented x3. HEENT: NCAT, EOMI Neck: Supple, trachea medial Cardiovascular: Regular rate and rhythm with no rubs or gallops.  No thyromegaly or  JVD noted.  No lower extremity edema. 2/4 pulses in all 4 extremities. Respiratory: Clear to auscultation with no wheezes or rales. Good inspiratory effort. Abdomen: Soft, tender to palpation of abdomen without guarding.  Nondistended with normal bowel sounds x4 quadrants. Muskuloskeletal: No cyanosis, clubbing or edema noted bilaterally Neuro: CN II-XII intact, strength 5/5 x 4, sensation, reflexes intact Skin: No ulcerative lesions noted or rashes Psychiatry: Judgement and insight appear normal. Mood is appropriate for condition and setting          Labs on Admission:  Basic Metabolic Panel: Recent Labs  Lab 09/04/24 2053  NA 134*  K 3.9  CL 101  CO2 23  GLUCOSE 134*  BUN 11  CREATININE 0.81  CALCIUM 10.3   Liver Function Tests: Recent Labs  Lab 09/04/24 2053  AST 37  ALT 36  ALKPHOS 64  BILITOT 0.9  PROT 7.4  ALBUMIN 4.0   Recent Labs  Lab 09/04/24 2053  LIPASE 50   No results for input(s): AMMONIA in the last 168 hours. CBC: Recent Labs  Lab 09/04/24 2053  WBC 11.5*  HGB 15.9  HCT 46.4  MCV 91.5  PLT 255   Cardiac Enzymes: No results for input(s): CKTOTAL, CKMB, CKMBINDEX, TROPONINI in the last 168 hours.  BNP (last 3 results) No results for input(s): BNP in the last 8760 hours.  ProBNP (last 3 results) No results for input(s): PROBNP in the last 8760 hours.  CBG: No results for input(s): GLUCAP in the last 168 hours.  Radiological Exams on Admission: CT ABDOMEN PELVIS W CONTRAST Result Date: 09/04/2024 CLINICAL DATA:  Abdominal pain EXAM: CT ABDOMEN AND PELVIS WITH CONTRAST TECHNIQUE: Multidetector CT imaging of the abdomen and pelvis was performed using the standard protocol following bolus administration of intravenous contrast. RADIATION DOSE REDUCTION: This exam was performed according to the departmental dose-optimization program which includes automated exposure control, adjustment of the mA and/or kV according to patient size  and/or use of iterative reconstruction technique. CONTRAST:  OMNIPAQUE  IOHEXOL  300 MG/ML  SOLN COMPARISON:  05/19/2021 FINDINGS: Lower chest: Fibrosis in the lung bases.  No acute findings. Hepatobiliary: No focal hepatic abnormality. Gallbladder unremarkable. Pancreas: No focal abnormality or ductal dilatation. Spleen: No focal abnormality.  Normal size. Adrenals/Urinary Tract: No adrenal abnormality. No focal renal abnormality. No stones or hydronephrosis. Urinary bladder is unremarkable. Stomach/Bowel: Normal appendix. Dilated fluid-filled stomach and small bowel. Small bowel dilated into the pelvis. Distal small bowel decompressed. Findings compatible with distal small bowel obstruction. Exact transition not visualized. Left colonic diverticulosis. No active diverticulitis. Vascular/Lymphatic: Aortic atherosclerosis. No evidence of aneurysm or adenopathy. Reproductive: No visible focal abnormality. Other: Trace free fluid in the cul-de-sac.  No free air. Musculoskeletal: No acute bony abnormality. IMPRESSION: Dilated fluid-filled stomach and small bowel with distal small bowel decompressed. Findings compatible with distal small bowel obstruction. Left colonic diverticulosis.  No active diverticulitis. Aortic atherosclerosis. Electronically Signed   By: Franky Crease  M.D.   On: 09/04/2024 22:26    EKG: I independently viewed the EKG done and my findings are as followed: EKG was not done in the ED  Assessment/Plan Present on Admission:  Small bowel obstruction (HCC)  Principal Problem:   Small bowel obstruction (HCC) Active Problems:   Asthma, chronic   Acquired hypothyroidism   Mixed hyperlipidemia  Small bowel obstruction Abdominal pain in the setting of above NG tube attempted 3 times failed and patient had transitory epistaxis, this was held at this time, patient has not been vomiting. Continue NPO at this time with plan to advance diet as tolerated Continue IV hydration Continue IV  morphine  2 mg q.4h p.r.n. for moderate/severe pain Continue Zofran  p.r.n. for nausea/vomiting General surgery (Dr. Mavis) was consulted and will follow-up with patient in the morning per ED PA.   Asthma (chronic) Continue Breo Ellipta   Acquired hypothyroidism Consider continuing on meds when patient resumes oral intake  Mixed hyperlipidemia Consider continuing on meds when patient resumes oral intake  DVT prophylaxis: Heparin  subcu  Code Status: Full code  Family Communication: Wife at bedside (all questions answered to satisfaction)  Consults: General Surgery  Severity of Illness: The appropriate patient status for this patient is INPATIENT. Inpatient status is judged to be reasonable and necessary in order to provide the required intensity of service to ensure the patient's safety. The patient's presenting symptoms, physical exam findings, and initial radiographic and laboratory data in the context of their chronic comorbidities is felt to place them at high risk for further clinical deterioration. Furthermore, it is not anticipated that the patient will be medically stable for discharge from the hospital within 2 midnights of admission.   * I certify that at the point of admission it is my clinical judgment that the patient will require inpatient hospital care spanning beyond 2 midnights from the point of admission due to high intensity of service, high risk for further deterioration and high frequency of surveillance required.*  Author: Colleene Swarthout, DO 09/05/2024 12:36 AM  For on call review [email or web address].

## 2024-09-04 NOTE — H&P (Incomplete)
 History and Physical    Patient: Chad Curtis FMW:[id number] DOB: [birth year] DOA: 09/04/2024 DOS: the patient was seen and examined on 09/04/2024 PCP: Kayla Drivers, MD  Patient coming from: Home  Chief Complaint:  Chief Complaint  Patient presents with  . Abdominal Pain   HPI: Chad Curtis is a 68 y.o. male with medical history significant of asthma, hypothyroidism, hyperlipidemia who presents to the emergency department due to abdominal pain which started this morning after breakfast.  Pain was generalized, intermittent and rated as 9/10 (maximum) on pain scale, this was associated with nausea without vomiting.  Patient endorsed 2-3 loose bowel movements prior to onset of symptoms this morning.  ED Course: In the emergency department, he was hemodynamically stable.  Workup in the ED showed normal CBC except for WBC of 11.5, BMP was normal except for sodium of 124 and blood glucose of 134.  Lipase 50, lactic acid was normal, urinalysis was normal. CT abdomen and pelvis with contrast showed Dilated fluid-filled stomach and small bowel with distal small bowel decompressed. Findings compatible with distal small bowel obstruction.   Review of Systems: Review of systems as noted in the HPI. All other systems reviewed and are negative.   Past Medical History:  Diagnosis Date  . Asthma   . Hypercholesteremia   . Hypothyroidism   . Shortness of breath dyspnea    Past Surgical History:  Procedure Laterality Date  . COLONOSCOPY N/A 01/24/2015   Procedure: COLONOSCOPY;  Surgeon: Claudis RAYMOND Rivet, MD;  Location: [facility];  Service: Endoscopy;  Laterality: N/A;  830  . COLONOSCOPY N/A 05/16/2020   Procedure: COLONOSCOPY;  Surgeon: Rivet Claudis RAYMOND, MD;  Location: [facility];  Service: Endoscopy;  Laterality: N/A;  830  . FLEXIBLE SIGMOIDOSCOPY  1996  . NASAL SINUS SURGERY      Social History:  reports that he has quit smoking. His smoking use included cigarettes. He has a 2.5  pack-year smoking history. He has never used smokeless tobacco. He reports that he does not drink alcohol  and does not use drugs.   Allergies  Allergen Reactions  . Penicillins Other (See Comments)    Does not know- was young    History reviewed. No pertinent family history.  ***  Prior to Admission medications   Medication Sig Start Date End Date Taking? Authorizing Provider  albuterol (VENTOLIN HFA) 108 (90 Base) MCG/ACT inhaler Inhale 2 puffs into the lungs 4 (four) times daily. 12/15/20   [provider]  azelastine (ASTELIN) 0.1 % nasal spray Place 1 spray into both nostrils at bedtime as needed for rhinitis or allergies. Use in each nostril as directed    [provider]  Fluticasone -Salmeterol (ADVAIR) 100-50 MCG/DOSE AEPB Inhale 1 puff into the lungs 2 (two) times daily.    [provider]  levothyroxine  (SYNTHROID , LEVOTHROID) 150 MCG tablet Take 150 mcg by mouth daily before breakfast.    [provider]  loratadine (CLARITIN) 10 MG tablet Take 10 mg by mouth daily as needed for allergies.    [provider]  mometasone (NASONEX) 50 MCG/ACT nasal spray Place 2 sprays into the nose daily as needed (congestion).    [provider]  omeprazole (PRILOSEC) 20 MG capsule Take by mouth 2 (two) times daily. 11/25/20   [provider]  simvastatin  (ZOCOR ) 20 MG tablet Take 20 mg by mouth at bedtime.     [provider]    Physical Exam: BP 139/84   Pulse 75  Temp 98.2 F (36.8 C) (Oral)   Resp 17   Ht 6' (1.829 m)   Wt 93 kg   SpO2 95%   BMI 27.80 kg/m   General: 68 y.o. year-old male well developed well nourished in no acute distress.  Alert and oriented x3. HEENT: NCAT, EOMI Neck: Supple, trachea medial Cardiovascular: Regular rate and rhythm with no rubs or gallops.  No thyromegaly or JVD noted.  No lower extremity edema. 2/4 pulses in all 4 extremities. Respiratory: Clear to auscultation with no  wheezes or rales. Good inspiratory effort. Abdomen: Soft, nontender nondistended with normal bowel sounds x4 quadrants. Muskuloskeletal: No cyanosis, clubbing or edema noted bilaterally Neuro: CN II-XII intact, strength 5/5 x 4, sensation, reflexes intact Skin: No ulcerative lesions noted or rashes Psychiatry: Judgement and insight appear normal. Mood is appropriate for condition and setting          Labs on Admission:  Basic Metabolic Panel: Recent Labs  Lab 09/04/24 2053  NA 134*  K 3.9  CL 101  CO2 23  GLUCOSE 134*  BUN 11  CREATININE 0.81  CALCIUM 10.3   Liver Function Tests: Recent Labs  Lab 09/04/24 2053  AST 37  ALT 36  ALKPHOS 64  BILITOT 0.9  PROT 7.4  ALBUMIN 4.0   Recent Labs  Lab 09/04/24 2053  LIPASE 50   No results for input(s): AMMONIA in the last 168 hours. CBC: Recent Labs  Lab 09/04/24 2053  WBC 11.5*  HGB 15.9  HCT 46.4  MCV 91.5  PLT 255   Cardiac Enzymes: No results for input(s): CKTOTAL, CKMB, CKMBINDEX, TROPONINI in the last 168 hours.  BNP (last 3 results) No results for input(s): BNP in the last 8760 hours.  ProBNP (last 3 results) No results for input(s): PROBNP in the last 8760 hours.  CBG: No results for input(s): GLUCAP in the last 168 hours.  Radiological Exams on Admission: CT ABDOMEN PELVIS W CONTRAST Result Date: 09/04/2024 CLINICAL DATA:  Abdominal pain EXAM: CT ABDOMEN AND PELVIS WITH CONTRAST TECHNIQUE: Multidetector CT imaging of the abdomen and pelvis was performed using the standard protocol following bolus administration of intravenous contrast. RADIATION DOSE REDUCTION: This exam was performed according to the departmental dose-optimization program which includes automated exposure control, adjustment of the mA and/or kV according to patient size and/or use of iterative reconstruction technique. CONTRAST:  OMNIPAQUE  IOHEXOL  300 MG/ML  SOLN COMPARISON:  05/19/2021 FINDINGS: Lower chest:  Fibrosis in the lung bases.  No acute findings. Hepatobiliary: No focal hepatic abnormality. Gallbladder unremarkable. Pancreas: No focal abnormality or ductal dilatation. Spleen: No focal abnormality.  Normal size. Adrenals/Urinary Tract: No adrenal abnormality. No focal renal abnormality. No stones or hydronephrosis. Urinary bladder is unremarkable. Stomach/Bowel: Normal appendix. Dilated fluid-filled stomach and small bowel. Small bowel dilated into the pelvis. Distal small bowel decompressed. Findings compatible with distal small bowel obstruction. Exact transition not visualized. Left colonic diverticulosis. No active diverticulitis. Vascular/Lymphatic: Aortic atherosclerosis. No evidence of aneurysm or adenopathy. Reproductive: No visible focal abnormality. Other: Trace free fluid in the cul-de-sac.  No free air. Musculoskeletal: No acute bony abnormality. IMPRESSION: Dilated fluid-filled stomach and small bowel with distal small bowel decompressed. Findings compatible with distal small bowel obstruction. Left colonic diverticulosis.  No active diverticulitis. Aortic atherosclerosis. Electronically Signed   By: Franky Crease M.D.   On: 09/04/2024 22:26    EKG: I independently viewed the EKG done and my findings are as followed: ***   Assessment/Plan Present on Admission: .  Small bowel obstruction (HCC)  Principal Problem:   Small bowel obstruction (HCC)   DVT prophylaxis: ***   Code Status: ***   Family Communication: ***   Disposition Plan: ***   Consults called: ***   Admission status: ***     Posey Maier MD [facility] Pager [phone]  If 7PM-7AM, please contact night-coverage [email or web address] Password [email or web address]  09/05/2024, 12:01 AM      Review of Systems: {ROS_Text:26778} Past Medical History:  Diagnosis Date  . Asthma   . Hypercholesteremia   . Hypothyroidism   . Shortness of breath dyspnea    Past Surgical History:  Procedure Laterality Date  .  COLONOSCOPY N/A 01/24/2015   Procedure: COLONOSCOPY;  Surgeon: Claudis RAYMOND Rivet, MD;  Location: [facility];  Service: Endoscopy;  Laterality: N/A;  830  . COLONOSCOPY N/A 05/16/2020   Procedure: COLONOSCOPY;  Surgeon: Rivet Claudis RAYMOND, MD;  Location: [facility];  Service: Endoscopy;  Laterality: N/A;  830  . FLEXIBLE SIGMOIDOSCOPY  1996  . NASAL SINUS SURGERY     Social History:  reports that he has quit smoking. His smoking use included cigarettes. He has a 2.5 pack-year smoking history. He has never used smokeless tobacco. He reports that he does not drink alcohol  and does not use drugs.  Allergies  Allergen Reactions  . Penicillins Other (See Comments)    Does not know- was young    History reviewed. No pertinent family history.  Prior to Admission medications   Medication Sig Start Date End Date Taking? Authorizing Provider  albuterol (VENTOLIN HFA) 108 (90 Base) MCG/ACT inhaler Inhale 2 puffs into the lungs 4 (four) times daily. 12/15/20   [provider]  azelastine (ASTELIN) 0.1 % nasal spray Place 1 spray into both nostrils at bedtime as needed for rhinitis or allergies. Use in each nostril as directed    [provider]  Fluticasone -Salmeterol (ADVAIR) 100-50 MCG/DOSE AEPB Inhale 1 puff into the lungs 2 (two) times daily.    [provider]  levothyroxine  (SYNTHROID , LEVOTHROID) 150 MCG tablet Take 150 mcg by mouth daily before breakfast.    [provider]  loratadine (CLARITIN) 10 MG tablet Take 10 mg by mouth daily as needed for allergies.    [provider]  mometasone (NASONEX) 50 MCG/ACT nasal spray Place 2 sprays into the nose daily as needed (congestion).    [provider]  omeprazole (PRILOSEC) 20 MG capsule Take by mouth 2 (two) times daily. 11/25/20   [provider]  simvastatin  (ZOCOR ) 20 MG tablet Take 20 mg by mouth at bedtime.     [provider]    Physical Exam: Vitals:   09/04/24  2200 09/04/24 2230 09/04/24 2245 09/04/24 2300  BP: 131/86 137/85 (!) 154/83 139/84  Pulse: 74 75 84 75  Resp: 16 10 17 17   Temp:      TempSrc:      SpO2: 95% 96% 96% 95%  Weight:      Height:       *** Data Reviewed: {Tip this will not be part of the note when signed- Document your independent interpretation of telemetry tracing, EKG, lab, Radiology test or any other diagnostic tests. Add any new diagnostic test ordered today. (Optional):26781} {Results:26384}  Assessment and Plan: No notes have been filed under this hospital service. Service: Hospitalist     Advance Care Planning:   Code Status: Not on file ***  Consults: ***  Family Communication: ***  Severity of Illness: {Observation/Inpatient:21159}  Author: Delrick Dehart, DO 09/04/2024 11:08 PM  For on call review [email or web address].

## 2024-09-04 NOTE — ED Triage Notes (Signed)
 Pt to ED with c/o intermittent abd pain that started this morning after eating breakfast. Pt says he feels like if he threw up, he would feel better, but hasn't been able to also reports stomach gurgling

## 2024-09-05 DIAGNOSIS — E039 Hypothyroidism, unspecified: Secondary | ICD-10-CM | POA: Insufficient documentation

## 2024-09-05 DIAGNOSIS — K56609 Unspecified intestinal obstruction, unspecified as to partial versus complete obstruction: Secondary | ICD-10-CM | POA: Diagnosis not present

## 2024-09-05 DIAGNOSIS — E782 Mixed hyperlipidemia: Secondary | ICD-10-CM | POA: Diagnosis not present

## 2024-09-05 DIAGNOSIS — K567 Ileus, unspecified: Secondary | ICD-10-CM | POA: Diagnosis not present

## 2024-09-05 DIAGNOSIS — J45909 Unspecified asthma, uncomplicated: Secondary | ICD-10-CM | POA: Insufficient documentation

## 2024-09-05 LAB — CBC
HCT: 43 % (ref 39.0–52.0)
Hemoglobin: 14.7 g/dL (ref 13.0–17.0)
MCH: 31.2 pg (ref 26.0–34.0)
MCHC: 34.2 g/dL (ref 30.0–36.0)
MCV: 91.3 fL (ref 80.0–100.0)
Platelets: 277 K/uL (ref 150–400)
RBC: 4.71 MIL/uL (ref 4.22–5.81)
RDW: 12.1 % (ref 11.5–15.5)
WBC: 9.7 K/uL (ref 4.0–10.5)
nRBC: 0 % (ref 0.0–0.2)

## 2024-09-05 LAB — COMPREHENSIVE METABOLIC PANEL WITH GFR
ALT: 32 U/L (ref 0–44)
AST: 34 U/L (ref 15–41)
Albumin: 3.6 g/dL (ref 3.5–5.0)
Alkaline Phosphatase: 58 U/L (ref 38–126)
Anion gap: 9 (ref 5–15)
BUN: 11 mg/dL (ref 8–23)
CO2: 23 mmol/L (ref 22–32)
Calcium: 9.6 mg/dL (ref 8.9–10.3)
Chloride: 103 mmol/L (ref 98–111)
Creatinine, Ser: 0.72 mg/dL (ref 0.61–1.24)
GFR, Estimated: >60 mL/min (ref >60)
Glucose, Bld: 106 mg/dL — ABNORMAL HIGH (ref 70–99)
Potassium: 4.1 mmol/L (ref 3.5–5.1)
Sodium: 135 mmol/L (ref 135–145)
Total Bilirubin: 0.7 mg/dL (ref 0.0–1.2)
Total Protein: 6.4 g/dL — ABNORMAL LOW (ref 6.5–8.1)

## 2024-09-05 LAB — MAGNESIUM: Magnesium: 2.3 mg/dL (ref 1.7–2.4)

## 2024-09-05 LAB — PHOSPHORUS: Phosphorus: 3.1 mg/dL (ref 2.5–4.6)

## 2024-09-05 LAB — HIV ANTIBODY (ROUTINE TESTING W REFLEX): HIV Screen 4th Generation wRfx: NONREACTIVE

## 2024-09-05 MED ORDER — HEPARIN SODIUM (PORCINE) 5000 UNIT/ML IJ SOLN
5000.0000 [IU] | Freq: Three times a day (TID) | INTRAMUSCULAR | Status: DC
  Administered 2024-09-05: 5000 [IU] via SUBCUTANEOUS
  Filled 2024-09-05: qty 1

## 2024-09-05 MED ORDER — ONDANSETRON HCL 4 MG/2ML IJ SOLN: 4.0000 mg | Freq: Four times a day (QID) | INTRAMUSCULAR | Status: DC | PRN

## 2024-09-05 MED ORDER — LEVOTHYROXINE SODIUM 75 MCG PO TABS
150.0000 ug | ORAL_TABLET | Freq: Every day | ORAL | Status: DC
  Administered 2024-09-05: 150 ug via ORAL
  Filled 2024-09-05: qty 2

## 2024-09-05 MED ORDER — LACTATED RINGERS IV SOLN
INTRAVENOUS | Status: DC
Start: 2024-09-05 — End: 2024-09-05

## 2024-09-05 MED ORDER — ENOXAPARIN SODIUM 40 MG/0.4ML IJ SOSY
40.0000 mg | PREFILLED_SYRINGE | INTRAMUSCULAR | Status: DC
Start: 2024-09-05 — End: 2024-09-05

## 2024-09-05 MED ORDER — SIMVASTATIN 20 MG PO TABS: 20.0000 mg | ORAL_TABLET | Freq: Every day | ORAL | Status: DC

## 2024-09-05 MED ORDER — MORPHINE SULFATE (PF) 2 MG/ML IV SOLN: 2.0000 mg | INTRAVENOUS | Status: DC | PRN

## 2024-09-05 MED ORDER — LACTATED RINGERS IV SOLN
INTRAVENOUS | Status: DC
Start: 2024-09-05 — End: 2024-09-05
  Administered 2024-09-05: 1000 mL via INTRAVENOUS

## 2024-09-05 MED ORDER — ACETAMINOPHEN 650 MG RE SUPP: 650.0000 mg | Freq: Four times a day (QID) | RECTAL | Status: DC | PRN

## 2024-09-05 MED ORDER — ACETAMINOPHEN 325 MG PO TABS: 650.0000 mg | ORAL_TABLET | Freq: Four times a day (QID) | ORAL | Status: DC | PRN

## 2024-09-05 MED ORDER — FLUTICASONE FUROATE-VILANTEROL 100-25 MCG/ACT IN AEPB
1.0000 | INHALATION_SPRAY | Freq: Every day | RESPIRATORY_TRACT | Status: DC
  Filled 2024-09-05: qty 28

## 2024-09-05 MED ORDER — ONDANSETRON HCL 4 MG PO TABS: 4.0000 mg | ORAL_TABLET | Freq: Four times a day (QID) | ORAL | Status: DC | PRN

## 2024-09-05 NOTE — Care Management Important Message (Signed)
 Important Message  Patient Details  Name: Chad Curtis MRN: [id number] Date of Birth: March 24, 1956   Important Message Given:  N/A - LOS <3 / Initial given by admissions     [name] 09/05/2024, 11:47 AM

## 2024-09-05 NOTE — Care Management CC44 (Signed)
 Condition Code 44 Documentation Completed  Patient Details  Name: Chad Curtis MRN: [id number] Date of Birth: 03-19-1956   Condition Code 44 given:  Yes Patient signature on Condition Code 44 notice:  Yes Documentation of 2 MD's agreement:  Yes Code 44 added to claim:  Yes    Mcarthur Saddie Kim, LCSW 09/05/2024, 3:23 PM

## 2024-09-05 NOTE — Plan of Care (Signed)
   Problem: Education: Goal: Knowledge of General Education information will improve Description Including pain rating scale, medication(s)/side effects and non-pharmacologic comfort measures Outcome: Progressing   Problem: Health Behavior/Discharge Planning: Goal: Ability to manage health-related needs will improve Outcome: Progressing

## 2024-09-05 NOTE — Hospital Course (Addendum)
 68 year old male with a history of asthma, hyperlipidemia, hypothyroidism, GERD presenting with 1 day history of upper abdominal pain that began after eating breakfast on the morning of 09/04/2024.  He had 2 bowel movements without much relief of his abdominal pain prior to coming to the emergency department.  He had some nausea without emesis.  Because of worsening abdominal pain he presented for further evaluation and treatment.  He denies any fevers, chills, chest pain, shortness breath, coughing, hemoptysis.  There is no hematochezia or melena.  He denies any dysuria or hematuria.  He denies any new medications. He states that he has not had any intra abdominal surgeries in the past. In the ED, NG tube was attempted x 3 without success.  Fortunately, the patient has subsequently had at least 3 bowel movements since arrival to the hospital.  He is passing flatus.  He states he has abdominal pain is improved.  In the ED, the patient was afebrile hemodynamically stable with oxygen saturation 99% room air.  WBC 11.5, hemoglobin 15.9, platelets 255.  Sodium 134, potassium 3.9, bicarbonate 23, serum creatinine 0.81.  LFTs were unremarkable.  Lactic acid 1.0 CT of the abdomen and pelvis showed dilated fluid-filled stomach and small bowel with the distal small bowel decompressed. General surgery was consulted. After arrival to the medical floor on the morning of 09/05/2024, the patient started to pass flatus.  He had had 3 bowel movements since arrival to the emergency department.  His diet was advanced to a soft diet.  He tolerated his diet without difficulty.  The patient improved faster than anticipated and was discharged home in stable condition on 09/05/2024.

## 2024-09-05 NOTE — Plan of Care (Signed)
  Problem: Education: Goal: Knowledge of General Education information will improve Description: Including pain rating scale, medication(s)/side effects and non-pharmacologic comfort measures 09/05/2024 1546 by Lynnette Cena CROME, RN Outcome: Adequate for Discharge 09/05/2024 1205 by Lynnette Cena CROME, RN Outcome: Progressing   Problem: Health Behavior/Discharge Planning: Goal: Ability to manage health-related needs will improve 09/05/2024 1546 by Lynnette Cena CROME, RN Outcome: Adequate for Discharge 09/05/2024 1205 by Lynnette Cena CROME, RN Outcome: Progressing   Problem: Clinical Measurements: Goal: Ability to maintain clinical measurements within normal limits will improve Outcome: Adequate for Discharge Goal: Will remain free from infection Outcome: Adequate for Discharge Goal: Diagnostic test results will improve Outcome: Adequate for Discharge Goal: Respiratory complications will improve Outcome: Adequate for Discharge Goal: Cardiovascular complication will be avoided Outcome: Adequate for Discharge   Problem: Activity: Goal: Risk for activity intolerance will decrease Outcome: Adequate for Discharge   Problem: Nutrition: Goal: Adequate nutrition will be maintained Outcome: Adequate for Discharge   Problem: Coping: Goal: Level of anxiety will decrease Outcome: Adequate for Discharge   Problem: Elimination: Goal: Will not experience complications related to bowel motility Outcome: Adequate for Discharge Goal: Will not experience complications related to urinary retention Outcome: Adequate for Discharge   Problem: Pain Managment: Goal: General experience of comfort will improve and/or be controlled Outcome: Adequate for Discharge   Problem: Safety: Goal: Ability to remain free from injury will improve Outcome: Adequate for Discharge   Problem: Skin Integrity: Goal: Risk for impaired skin integrity will decrease Outcome: Adequate for Discharge

## 2024-09-05 NOTE — Progress Notes (Signed)
 PROGRESS NOTE  Chad Curtis FMW:[id number] DOB: 10-19-1956 DOA: 09/04/2024 PCP: Kayla Drivers, MD  Brief History:  68 year old male with a history of asthma, hyperlipidemia, hypothyroidism, GERD presenting with 1 day history of upper abdominal pain that began after eating breakfast on the morning of 09/04/2024.  He had 2 bowel movements without much relief of his abdominal pain prior to coming to the emergency department.  He had some nausea without emesis.  Because of worsening abdominal pain he presented for further evaluation and treatment.  He denies any fevers, chills, chest pain, shortness breath, coughing, hemoptysis.  There is no hematochezia or melena.  He denies any dysuria or hematuria.  He denies any new medications. He states that he has not had any intra abdominal surgeries in the past. In the ED, NG tube was attempted x 3 without success.  Fortunately, the patient has subsequently had at least 3 bowel movements since arrival to the hospital.  He is passing flatus.  He states he has abdominal pain is improved.  In the ED, the patient was afebrile hemodynamically stable with oxygen saturation 99% room air.  WBC 11.5, hemoglobin 15.9, platelets 255.  Sodium 134, potassium 3.9, bicarbonate 23, serum creatinine 0.81.  LFTs were unremarkable.  Lactic acid 1.0 CT of the abdomen and pelvis showed dilated fluid-filled stomach and small bowel with the distal small bowel decompressed. General surgery was consulted.    Assessment/Plan:  Partial small bowel obstruction/ileus -General Surgery consulted -Patient appears to be clinically improving -Full liquid diet for now pending surgery consult -IV fluids  Hypothyroidism -Restart Synthroid   Mixed hyperlipidemia -Continue statin  Asthma -Stable on room air -Continue Advair  GERD -Continue PPI       Family Communication:   wife at bedside 9/9  Consultants:  general surgery  Code Status:  FULL   DVT Prophylaxis:   [place] Heparin     Procedures: As Listed in Progress Note Above  Antibiotics: None    Subjective: Patient is passing flatus and has had 3 bowel movements overnight.  He denies any fevers, chills, chest pain, shortness breath, cough, hemoptysis.  There is no hematochezia or melena.  There is no dysuria.  Objective: Vitals:   09/04/24 2300 09/05/24 0015 09/05/24 0037 09/05/24 0507  BP: 139/84  125/82 123/77  Pulse: 75  91 69  Resp: 17  20 18   Temp:  (!) 97.5 F (36.4 C) (!) 97.4 F (36.3 C) (!) 97.5 F (36.4 C)  TempSrc:  Oral Oral Oral  SpO2: 95%   99%  Weight:   95.2 kg   Height:        Intake/Output Summary (Last 24 hours) at 09/05/2024 0747 Last data filed at 09/05/2024 0500 Gross per 24 hour  Intake 38.07 ml  Output 475 ml  Net -436.93 ml   Weight change:  Exam:  General:  Pt is alert, follows commands appropriately, not in acute distress HEENT: No icterus, No thrush, No neck mass, [place]/AT Cardiovascular: RRR, S1/S2, no rubs, no gallops Respiratory: CTA bilaterally, no wheezing, no crackles, no rhonchi Abdomen: Soft/+BS, non tender, non distended, no guarding Extremities: No edema, No lymphangitis, No petechiae, No rashes, no synovitis   Data Reviewed: I have personally reviewed following labs and imaging studies Basic Metabolic Panel: Recent Labs  Lab 09/04/24 2053 09/05/24 0447  NA 134* 135  K 3.9 4.1  CL 101 103  CO2 23 23  GLUCOSE 134* 106*  BUN 11 11  CREATININE 0.81 0.72  CALCIUM 10.3 9.6  MG  --  2.3  PHOS  --  3.1   Liver Function Tests: Recent Labs  Lab 09/04/24 2053 09/05/24 0447  AST 37 34  ALT 36 32  ALKPHOS 64 58  BILITOT 0.9 0.7  PROT 7.4 6.4*  ALBUMIN 4.0 3.6   Recent Labs  Lab 09/04/24 2053  LIPASE 50   No results for input(s): AMMONIA in the last 168 hours. Coagulation Profile: No results for input(s): INR, PROTIME in the last 168 hours. CBC: Recent Labs  Lab 09/04/24 2053 09/05/24 0447  WBC 11.5* 9.7  HGB 15.9  14.7  HCT 46.4 43.0  MCV 91.5 91.3  PLT 255 277   Cardiac Enzymes: No results for input(s): CKTOTAL, CKMB, CKMBINDEX, TROPONINI in the last 168 hours. BNP: Invalid input(s): POCBNP CBG: No results for input(s): GLUCAP in the last 168 hours. HbA1C: No results for input(s): HGBA1C in the last 72 hours. Urine analysis:    Component Value Date/Time   COLORURINE YELLOW 09/04/2024 2030   APPEARANCEUR CLEAR 09/04/2024 2030   LABSPEC 1.016 09/04/2024 2030   PHURINE 5.0 09/04/2024 2030   GLUCOSEU NEGATIVE 09/04/2024 2030   HGBUR NEGATIVE 09/04/2024 2030   BILIRUBINUR NEGATIVE 09/04/2024 2030   KETONESUR NEGATIVE 09/04/2024 2030   PROTEINUR NEGATIVE 09/04/2024 2030   NITRITE NEGATIVE 09/04/2024 2030   LEUKOCYTESUR NEGATIVE 09/04/2024 2030   Sepsis Labs: @LABRCNTIP (procalcitonin:4,lacticidven:4) )No results found for this or any previous visit (from the past 240 hours).   Scheduled Meds:  fluticasone  furoate-vilanterol  1 puff Inhalation Daily   heparin   5,000 Units Subcutaneous Q8H   Continuous Infusions:  lactated ringers  100 mL/hr at 09/05/24 0226    Procedures/Studies: CT ABDOMEN PELVIS W CONTRAST Result Date: 09/04/2024 CLINICAL DATA:  Abdominal pain EXAM: CT ABDOMEN AND PELVIS WITH CONTRAST TECHNIQUE: Multidetector CT imaging of the abdomen and pelvis was performed using the standard protocol following bolus administration of intravenous contrast. RADIATION DOSE REDUCTION: This exam was performed according to the departmental dose-optimization program which includes automated exposure control, adjustment of the mA and/or kV according to patient size and/or use of iterative reconstruction technique. CONTRAST:  OMNIPAQUE  IOHEXOL  300 MG/ML  SOLN COMPARISON:  05/19/2021 FINDINGS: Lower chest: Fibrosis in the lung bases.  No acute findings. Hepatobiliary: No focal hepatic abnormality. Gallbladder unremarkable. Pancreas: No focal abnormality or ductal dilatation.  Spleen: No focal abnormality.  Normal size. Adrenals/Urinary Tract: No adrenal abnormality. No focal renal abnormality. No stones or hydronephrosis. Urinary bladder is unremarkable. Stomach/Bowel: Normal appendix. Dilated fluid-filled stomach and small bowel. Small bowel dilated into the pelvis. Distal small bowel decompressed. Findings compatible with distal small bowel obstruction. Exact transition not visualized. Left colonic diverticulosis. No active diverticulitis. Vascular/Lymphatic: Aortic atherosclerosis. No evidence of aneurysm or adenopathy. Reproductive: No visible focal abnormality. Other: Trace free fluid in the cul-de-sac.  No free air. Musculoskeletal: No acute bony abnormality. IMPRESSION: Dilated fluid-filled stomach and small bowel with distal small bowel decompressed. Findings compatible with distal small bowel obstruction. Left colonic diverticulosis.  No active diverticulitis. Aortic atherosclerosis. Electronically Signed   By: Franky Crease M.D.   On: 09/04/2024 22:26    Alm Schneider, DO  [facility]  If 7PM-7AM, please contact night-coverage [email or web address] Password [email or web address] 09/05/2024, 7:47 AM   LOS: 1 day

## 2024-09-05 NOTE — Discharge Summary (Signed)
 Physician Discharge Summary   Patient: Chad Curtis MRN: [id number] DOB: [birth year]  Admit date:     09/04/2024  Discharge date: 09/05/24  Discharge Physician: Alm Inari Shin   PCP: Kayla Drivers, MD   Recommendations at discharge:  { Please follow up with primary care provider within 1-2 weeks  Please repeat BMP and CBC in one week     Hospital Course: 68 year old male with a history of asthma, hyperlipidemia, hypothyroidism, GERD presenting with 1 day history of upper abdominal pain that began after eating breakfast on the morning of 09/04/2024.  He had 2 bowel movements without much relief of his abdominal pain prior to coming to the emergency department.  He had some nausea without emesis.  Because of worsening abdominal pain he presented for further evaluation and treatment.  He denies any fevers, chills, chest pain, shortness breath, coughing, hemoptysis.  There is no hematochezia or melena.  He denies any dysuria or hematuria.  He denies any new medications. He states that he has not had any intra abdominal surgeries in the past. In the ED, NG tube was attempted x 3 without success.  Fortunately, the patient has subsequently had at least 3 bowel movements since arrival to the hospital.  He is passing flatus.  He states he has abdominal pain is improved.  In the ED, the patient was afebrile hemodynamically stable with oxygen saturation 99% room air.  WBC 11.5, hemoglobin 15.9, platelets 255.  Sodium 134, potassium 3.9, bicarbonate 23, serum creatinine 0.81.  LFTs were unremarkable.  Lactic acid 1.0 CT of the abdomen and pelvis showed dilated fluid-filled stomach and small bowel with the distal small bowel decompressed. General surgery was consulted. After arrival to the medical floor on the morning of 09/05/2024, the patient started to pass flatus.  He had had 3 bowel movements since arrival to the emergency department.  His diet was advanced to a soft diet.  He tolerated his diet without  difficulty.  The patient improved faster than anticipated and was discharged home in stable condition on 09/05/2024.   Assessment and Plan:  Ileus -General Surgery consulted>>did not feel pt had SBO -Patient having BMs and flatus even while in ED -continued to have 2 BMs even after arrival to medical floor; abd pain resolved -Full liquid diet for now pending surgery consult -9/9 discussed with Dr. Ysidro to soft diet -9/9 tolerated lunch soft diet>>d/c home -IV fluids given   Hypothyroidism -Restarted Synthroid    Mixed hyperlipidemia -Continue statin   Asthma -Stable on room air -Continue Advair   GERD -Continue PPI   Consultants: general surgery Procedures performed: none  Disposition: Home Diet recommendation:  Soft diet DISCHARGE MEDICATION: Allergies as of 09/05/2024       Reactions   Penicillins Other (See Comments)   Does not know- was young        Medication List     TAKE these medications    albuterol 108 (90 Base) MCG/ACT inhaler Commonly known as: VENTOLIN HFA Inhale 2 puffs into the lungs 4 (four) times daily.   azelastine 0.1 % nasal spray Commonly known as: ASTELIN Place 1 spray into both nostrils at bedtime as needed for rhinitis or allergies. Use in each nostril as directed   Fluticasone -Salmeterol 100-50 MCG/DOSE Aepb Commonly known as: ADVAIR Inhale 1 puff into the lungs 2 (two) times daily.   levothyroxine  175 MCG tablet Commonly known as: SYNTHROID  Take 175 mcg by mouth daily before breakfast.   loratadine 10 MG tablet Commonly known as:  CLARITIN Take 10 mg by mouth daily as needed for allergies.   mometasone 50 MCG/ACT nasal spray Commonly known as: NASONEX Place 2 sprays into the nose daily as needed (congestion).   omeprazole 20 MG capsule Commonly known as: PRILOSEC Take by mouth daily.   rosuvastatin 10 MG tablet Commonly known as: CRESTOR Take 10 mg by mouth at bedtime.        Discharge Exam: Filed  Weights   09/04/24 1940 09/05/24 0037  Weight: 93 kg 95.2 kg   HEENT:  [place]/AT, No thrush, no icterus CV:  RRR, no rub, no S3, no S4 Lung:  CTA, no wheeze, no rhonchi Abd:  soft/+BS, NT Ext:  No edema, no lymphangitis, no synovitis, no rash   Condition at discharge: stable  The results of significant diagnostics from this hospitalization (including imaging, microbiology, ancillary and laboratory) are listed below for reference.   Imaging Studies: CT ABDOMEN PELVIS W CONTRAST Result Date: 09/04/2024 CLINICAL DATA:  Abdominal pain EXAM: CT ABDOMEN AND PELVIS WITH CONTRAST TECHNIQUE: Multidetector CT imaging of the abdomen and pelvis was performed using the standard protocol following bolus administration of intravenous contrast. RADIATION DOSE REDUCTION: This exam was performed according to the departmental dose-optimization program which includes automated exposure control, adjustment of the mA and/or kV according to patient size and/or use of iterative reconstruction technique. CONTRAST:  OMNIPAQUE  IOHEXOL  300 MG/ML  SOLN COMPARISON:  05/19/2021 FINDINGS: Lower chest: Fibrosis in the lung bases.  No acute findings. Hepatobiliary: No focal hepatic abnormality. Gallbladder unremarkable. Pancreas: No focal abnormality or ductal dilatation. Spleen: No focal abnormality.  Normal size. Adrenals/Urinary Tract: No adrenal abnormality. No focal renal abnormality. No stones or hydronephrosis. Urinary bladder is unremarkable. Stomach/Bowel: Normal appendix. Dilated fluid-filled stomach and small bowel. Small bowel dilated into the pelvis. Distal small bowel decompressed. Findings compatible with distal small bowel obstruction. Exact transition not visualized. Left colonic diverticulosis. No active diverticulitis. Vascular/Lymphatic: Aortic atherosclerosis. No evidence of aneurysm or adenopathy. Reproductive: No visible focal abnormality. Other: Trace free fluid in the cul-de-sac.  No free air.  Musculoskeletal: No acute bony abnormality. IMPRESSION: Dilated fluid-filled stomach and small bowel with distal small bowel decompressed. Findings compatible with distal small bowel obstruction. Left colonic diverticulosis.  No active diverticulitis. Aortic atherosclerosis. Electronically Signed   By: Franky Crease M.D.   On: 09/04/2024 22:26    Microbiology: Results for orders placed or performed during the hospital encounter of 05/14/20  SARS CORONAVIRUS 2 ([place] 6-24 HRS) Nasopharyngeal Nasopharyngeal Swab     Status: None   Collection Time: 05/14/20  3:45 PM   Specimen: Nasopharyngeal Swab  Result Value Ref Range Status   SARS Coronavirus 2 NEGATIVE NEGATIVE Final    Comment: (NOTE) SARS-CoV-2 target nucleic acids are NOT DETECTED. The SARS-CoV-2 RNA is generally detectable in upper and lower respiratory specimens during the acute phase of infection. Negative results do not preclude SARS-CoV-2 infection, do not rule out co-infections with other pathogens, and should not be used as the sole basis for treatment or other patient management decisions. Negative results must be combined with clinical observations, patient history, and epidemiological information. The expected result is Negative. Fact Sheet for Patients: HairSlick.no Fact Sheet for Healthcare Providers: quierodirigir.com This test is not yet approved or cleared by the [place]  FDA and  has been authorized for detection and/or diagnosis of SARS-CoV-2 by FDA under an Emergency Use Authorization (EUA). This EUA will remain  in effect (meaning this test can be used) for the duration of  the COVID-19 declaration under Section 56 4(b)(1) of the Act, 21 U.S.C. section 360bbb-3(b)(1), unless the authorization is terminated or revoked sooner. Performed at [facility], [address], [place], [place] [address]     Labs: CBC: Recent Labs  Lab 09/04/24 2053  09/05/24 0447  WBC 11.5* 9.7  HGB 15.9 14.7  HCT 46.4 43.0  MCV 91.5 91.3  PLT 255 277   Basic Metabolic Panel: Recent Labs  Lab 09/04/24 2053 09/05/24 0447  NA 134* 135  K 3.9 4.1  CL 101 103  CO2 23 23  GLUCOSE 134* 106*  BUN 11 11  CREATININE 0.81 0.72  CALCIUM 10.3 9.6  MG  --  2.3  PHOS  --  3.1   Liver Function Tests: Recent Labs  Lab 09/04/24 2053 09/05/24 0447  AST 37 34  ALT 36 32  ALKPHOS 64 58  BILITOT 0.9 0.7  PROT 7.4 6.4*  ALBUMIN 4.0 3.6   CBG: No results for input(s): GLUCAP in the last 168 hours.  Discharge time spent: greater than 30 minutes.  Signed: Alm Schneider, MD [facility] 09/05/2024

## 2024-09-05 NOTE — Care Management Obs Status (Signed)
 MEDICARE OBSERVATION STATUS NOTIFICATION   Patient Details  Name: Chad Curtis MRN: [id number] Date of Birth: 03-22-1956   Medicare Observation Status Notification Given:  Yes    Mcarthur Saddie Kim, LCSW 09/05/2024, 3:23 PM

## 2024-09-05 NOTE — Progress Notes (Signed)
 Transition of Care Department [facility]) has reviewed patient and no other TOC needs have been identified at this time. We will continue to monitor patient advancement through interdisciplinary progression rounds. If new patient transition needs arise, please place a TOC consult.   09/05/24 0820  TOC Brief Assessment  Insurance and Status Reviewed  Patient has primary care physician Yes  Home environment has been reviewed Lives with wife.  Prior level of function: Independent.  Prior/Current Home Services No current home services  Social Drivers of Health Review SDOH reviewed no interventions necessary  Readmission risk has been reviewed Yes  Transition of care needs no transition of care needs at this time

## 2024-09-05 NOTE — Consult Note (Addendum)
 [facility] Consult  Reason for Consult: Possible SBO Referring Physician: Sherlean Carota, PA-C  Chief Complaint   Abdominal Pain     HPI: Chad Curtis is a 68 y.o. male with a history of HLD, asthma, hiatal hernia, and ulcers who presented to the ED for severe epigastric pain. General surgery was consulted for a possible SBO. On interview, he reports experiencing severe epigastric pain since yesterday morning. He states that the pain started shortly after breakfast. He describes the pain as an ache or cramp confined to the epigastric region. He denied any radiation to his lower abdomen, back, or flanks. He states that the pain was better after bowel movements and had a feeling of needing to vomit. He denies vomiting, but endorsed nausea with the pain. He denies anything that might worsen his pain. He rates the pain as a 9 out of 10 and states that the pain comes and goes. He reports that since being transferred upstairs, he has not had any pain. He endorses having multiple watery bowel movements since yesterday morning. He endorses having 3 watery bowel movements in the ED with another 4 to 5 movements since inpatient. He denies any blood or mucus in his stool. He endorses good urine output, but denies passing flatus out of fear of his diarrhea. He also denies any vomiting, nausea, fever, chills, sweating, or pain since being moved from the ED to the inpatient floors. He currently reports no other symptoms.  Past Medical History:  Diagnosis Date   Asthma    Hypercholesteremia    Hypothyroidism    Shortness of breath dyspnea     Past Surgical History:  Procedure Laterality Date   COLONOSCOPY N/A 01/24/2015   Procedure: COLONOSCOPY;  Surgeon: Claudis RAYMOND Rivet, MD;  [facility];  Service: Endoscopy;  Laterality: N/A;  830   COLONOSCOPY N/A 05/16/2020   Procedure: COLONOSCOPY;  Surgeon: Rivet Claudis RAYMOND, MD;  Location: [facility];  Service: Endoscopy;   Laterality: N/A;  830   FLEXIBLE SIGMOIDOSCOPY  1996   NASAL SINUS SURGERY      History reviewed. No pertinent family history.  Social History   Tobacco Use   Smoking status: Former    Current packs/day: 0.50    Average packs/day: 0.5 packs/day for 5.0 years (2.5 ttl pk-yrs)    Types: Cigarettes   Smokeless tobacco: Never  Substance Use Topics   Alcohol  use: No   Drug use: No    Medications: I have reviewed the patient's current medications. Prior to Admission:  Medications Prior to Admission  Medication Sig Dispense Refill Last Dose/Taking   albuterol (VENTOLIN HFA) 108 (90 Base) MCG/ACT inhaler Inhale 2 puffs into the lungs 4 (four) times daily.   Unknown   Fluticasone -Salmeterol (ADVAIR) 100-50 MCG/DOSE AEPB Inhale 1 puff into the lungs 2 (two) times daily.   09/04/2024   levothyroxine  (SYNTHROID , LEVOTHROID) 150 MCG tablet Take 150 mcg by mouth daily before breakfast.   09/04/2024   loratadine (CLARITIN) 10 MG tablet Take 10 mg by mouth daily as needed for allergies.   09/04/2024   omeprazole (PRILOSEC) 20 MG capsule Take by mouth 2 (two) times daily.   09/04/2024   azelastine (ASTELIN) 0.1 % nasal spray Place 1 spray into both nostrils at bedtime as needed for rhinitis or allergies. Use in each nostril as directed   Unknown   mometasone (NASONEX) 50 MCG/ACT nasal spray Place 2 sprays into the nose daily as needed (congestion).   Unknown  simvastatin  (ZOCOR ) 20 MG tablet Take 20 mg by mouth at bedtime.    09/03/2024   Scheduled:  fluticasone  furoate-vilanterol  1 puff Inhalation Daily   heparin   5,000 Units Subcutaneous Q8H   levothyroxine   150 mcg Oral QAC breakfast   simvastatin   20 mg Oral QHS   Continuous:  lactated ringers      PRN:acetaminophen  **OR** acetaminophen , morphine  injection, ondansetron  **OR** ondansetron  (ZOFRAN ) IV  Allergies  Allergen Reactions   Penicillins Other (See Comments)    Does not know- was young     ROS:  Pertinent items are noted in  HPI.  Blood pressure 123/77, pulse 69, temperature (!) 97.5 F (36.4 C), temperature source Oral, resp. rate 18, height 6' (1.829 m), weight 95.2 kg, SpO2 99%. Physical Exam Constitutional:      General: He is not in acute distress.    Appearance: He is not ill-appearing or diaphoretic.  Cardiovascular:     Rate and Rhythm: Normal rate and regular rhythm.     Heart sounds: Normal heart sounds. No murmur heard.    No friction rub. No gallop.  Pulmonary:     Effort: Pulmonary effort is normal. No respiratory distress.     Breath sounds: Normal breath sounds.  Abdominal:     General: Abdomen is flat. Bowel sounds are normal. There is no distension or abdominal bruit.     Palpations: Abdomen is soft.     Tenderness: There is no abdominal tenderness.  Skin:    General: Skin is warm and dry.  Neurological:     Mental Status: He is alert and oriented to person, place, and time.  Psychiatric:        Mood and Affect: Mood normal.     Results: Results for orders placed or performed during the hospital encounter of 09/04/24 (from the past 48 hours)  Urinalysis, Routine w reflex microscopic -Urine, Clean Catch     Status: None   Collection Time: 09/04/24  8:30 PM  Result Value Ref Range   Color, Urine YELLOW YELLOW   APPearance CLEAR CLEAR   Specific Gravity, Urine 1.016 1.005 - 1.030   pH 5.0 5.0 - 8.0   Glucose, UA NEGATIVE NEGATIVE mg/dL   Hgb urine dipstick NEGATIVE NEGATIVE   Bilirubin Urine NEGATIVE NEGATIVE   Ketones, ur NEGATIVE NEGATIVE mg/dL   Protein, ur NEGATIVE NEGATIVE mg/dL   Nitrite NEGATIVE NEGATIVE   Leukocytes,Ua NEGATIVE NEGATIVE    Comment: Performed at [facility], [address], [place], [place] [address]  Lipase, blood     Status: None   Collection Time: 09/04/24  8:53 PM  Result Value Ref Range   Lipase 50 11 - 51 U/L    Comment: Performed at [facility] [address] [place], [place] [address]  Comprehensive metabolic panel     Status: Abnormal    Collection Time: 09/04/24  8:53 PM  Result Value Ref Range   Sodium 134 (L) 135 - 145 mmol/L   Potassium 3.9 3.5 - 5.1 mmol/L   Chloride 101 98 - 111 mmol/L   CO2 23 22 - 32 mmol/L   Glucose, Bld 134 (H) 70 - 99 mg/dL    Comment: Glucose reference range applies only to samples taken after fasting for at least 8 hours.   BUN 11 8 - 23 mg/dL   Creatinine, Ser 9.18 0.61 - 1.24 mg/dL   Calcium 89.6 8.9 - 89.6 mg/dL   Total Protein 7.4 6.5 - 8.1 g/dL   Albumin 4.0  3.5 - 5.0 g/dL   AST 37 15 - 41 U/L   ALT 36 0 - 44 U/L   Alkaline Phosphatase 64 38 - 126 U/L   Total Bilirubin 0.9 0.0 - 1.2 mg/dL   GFR, Estimated >39 >39 mL/min    Comment: (NOTE) Calculated using the CKD-EPI Creatinine Equation (2021)    Anion gap 10 5 - 15    Comment: Performed at [facility], [address] [place], [place] [address]  CBC     Status: Abnormal   Collection Time: 09/04/24  8:53 PM  Result Value Ref Range   WBC 11.5 (H) 4.0 - 10.5 K/uL   RBC 5.07 4.22 - 5.81 MIL/uL   Hemoglobin 15.9 13.0 - 17.0 g/dL   HCT 53.5 60.9 - 47.9 %   MCV 91.5 80.0 - 100.0 fL   MCH 31.4 26.0 - 34.0 pg   MCHC 34.3 30.0 - 36.0 g/dL   RDW 87.8 88.4 - 84.4 %   Platelets 255 150 - 400 K/uL   nRBC 0.0 0.0 - 0.2 %    Comment: Performed at [facility], [address], [place], [place] [address]  Lactic acid, plasma     Status: None   Collection Time: 09/04/24  8:53 PM  Result Value Ref Range   Lactic Acid, Venous 1.0 0.5 - 1.9 mmol/L    Comment: Performed at [facility], [address] [place], [place] [address]  Comprehensive metabolic panel     Status: Abnormal   Collection Time: 09/05/24  4:47 AM  Result Value Ref Range   Sodium 135 135 - 145 mmol/L   Potassium 4.1 3.5 - 5.1 mmol/L   Chloride 103 98 - 111 mmol/L   CO2 23 22 - 32 mmol/L   Glucose, Bld 106 (H) 70 - 99 mg/dL    Comment: Glucose reference range applies only to samples taken after fasting for at least 8 hours.   BUN 11 8 - 23 mg/dL   Creatinine, Ser 9.27  0.61 - 1.24 mg/dL   Calcium 9.6 8.9 - 89.6 mg/dL   Total Protein 6.4 (L) 6.5 - 8.1 g/dL   Albumin 3.6 3.5 - 5.0 g/dL   AST 34 15 - 41 U/L   ALT 32 0 - 44 U/L   Alkaline Phosphatase 58 38 - 126 U/L   Total Bilirubin 0.7 0.0 - 1.2 mg/dL   GFR, Estimated >39 >39 mL/min    Comment: (NOTE) Calculated using the CKD-EPI Creatinine Equation (2021)    Anion gap 9 5 - 15    Comment: Performed at [facility], [address], [place], [place] [address]  CBC     Status: None   Collection Time: 09/05/24  4:47 AM  Result Value Ref Range   WBC 9.7 4.0 - 10.5 K/uL   RBC 4.71 4.22 - 5.81 MIL/uL   Hemoglobin 14.7 13.0 - 17.0 g/dL   HCT 56.9 60.9 - 47.9 %   MCV 91.3 80.0 - 100.0 fL   MCH 31.2 26.0 - 34.0 pg   MCHC 34.2 30.0 - 36.0 g/dL   RDW 87.8 88.4 - 84.4 %   Platelets 277 150 - 400 K/uL   nRBC 0.0 0.0 - 0.2 %    Comment: Performed at [facility], [address], [place], [place] [address]  Magnesium     Status: None   Collection Time: 09/05/24  4:47 AM  Result Value Ref Range   Magnesium 2.3 1.7 - 2.4 mg/dL    Comment:  Performed at [facility], [address], [place], [place] [address]  Phosphorus     Status: None   Collection Time: 09/05/24  4:47 AM  Result Value Ref Range   Phosphorus 3.1 2.5 - 4.6 mg/dL    Comment: Performed at [facility], [address] [place], [place] [address]    CT ABDOMEN PELVIS W CONTRAST Result Date: 09/04/2024 CLINICAL DATA:  Abdominal pain EXAM: CT ABDOMEN AND PELVIS WITH CONTRAST TECHNIQUE: Multidetector CT imaging of the abdomen and pelvis was performed using the standard protocol following bolus administration of intravenous contrast. RADIATION DOSE REDUCTION: This exam was performed according to the departmental dose-optimization program which includes automated exposure control, adjustment of the mA and/or kV according to patient size and/or use of iterative reconstruction technique. CONTRAST:  OMNIPAQUE  IOHEXOL  300 MG/ML  SOLN COMPARISON:   05/19/2021 FINDINGS: Lower chest: Fibrosis in the lung bases.  No acute findings. Hepatobiliary: No focal hepatic abnormality. Gallbladder unremarkable. Pancreas: No focal abnormality or ductal dilatation. Spleen: No focal abnormality.  Normal size. Adrenals/Urinary Tract: No adrenal abnormality. No focal renal abnormality. No stones or hydronephrosis. Urinary bladder is unremarkable. Stomach/Bowel: Normal appendix. Dilated fluid-filled stomach and small bowel. Small bowel dilated into the pelvis. Distal small bowel decompressed. Findings compatible with distal small bowel obstruction. Exact transition not visualized. Left colonic diverticulosis. No active diverticulitis. Vascular/Lymphatic: Aortic atherosclerosis. No evidence of aneurysm or adenopathy. Reproductive: No visible focal abnormality. Other: Trace free fluid in the cul-de-sac.  No free air. Musculoskeletal: No acute bony abnormality. IMPRESSION: Dilated fluid-filled stomach and small bowel with distal small bowel decompressed. Findings compatible with distal small bowel obstruction. Left colonic diverticulosis.  No active diverticulitis. Aortic atherosclerosis. Electronically Signed   By: Franky Crease M.D.   On: 09/04/2024 22:26     Assessment & Plan:  IBAN UTZ is a 68 y.o. male with history of HLD, asthma, hiatal hernia, and ulcers who presented to the ED for severe epigastric pain. General surgery was consulted for a possible SBO. Patient presents without significant epigastric or abdominal pain this morning and continues to have watery stools with intact bowel sounds. CT scan showed dilated stomach and small bowel with distal small bowel compression. Differential for patient includes: GERD, PUD, ileus, intestinal colic, SBO, gastritis, or constipation. Given patient's improved presentation, management is going to be conservative.  - Continue to monitor patient's pain and bowel movement schedule - Start patient on a full diet - Consider  further intervention (EGD) if patient's epigastric pain returns with significant worsening or if bowel movements cease  All questions were answered to the satisfaction of the patient and family.    Chad Curtis 09/05/2024, 8:01 AM

## 2024-09-05 NOTE — Progress Notes (Signed)
 Patient d/c by Warren Santa nurse.  Chad Curtis, Cena Helling, RN

## 2024-09-05 NOTE — Plan of Care (Signed)
  Problem: Clinical Measurements: Goal: Cardiovascular complication will be avoided Outcome: Progressing   Problem: Activity: Goal: Risk for activity intolerance will decrease Outcome: Progressing   Problem: Nutrition: Goal: Adequate nutrition will be maintained Outcome: Progressing   Problem: Elimination: Goal: Will not experience complications related to bowel motility Outcome: Progressing Goal: Will not experience complications related to urinary retention Outcome: Progressing   Problem: Pain Managment: Goal: General experience of comfort will improve and/or be controlled Outcome: Progressing   Problem: Safety: Goal: Ability to remain free from injury will improve Outcome: Progressing   Problem: Skin Integrity: Goal: Risk for impaired skin integrity will decrease Outcome: Progressing

## 2024-09-05 NOTE — ED Notes (Signed)
 Pt reports having a bowel movement. Movement consisted first of watery stool, then another bout of liquid with some solid

## 2024-10-16 DIAGNOSIS — H5203 Hypermetropia, bilateral: Secondary | ICD-10-CM | POA: Diagnosis not present

## 2024-11-27 DIAGNOSIS — R300 Dysuria: Secondary | ICD-10-CM | POA: Diagnosis not present

## 2024-12-06 DIAGNOSIS — K5730 Diverticulosis of large intestine without perforation or abscess without bleeding: Secondary | ICD-10-CM | POA: Diagnosis not present

## 2024-12-06 DIAGNOSIS — N23 Unspecified renal colic: Secondary | ICD-10-CM | POA: Diagnosis not present

## 2024-12-06 DIAGNOSIS — R319 Hematuria, unspecified: Secondary | ICD-10-CM | POA: Diagnosis not present
